# Patient Record
Sex: Female | Born: 1995 | Race: White | Hispanic: No | Marital: Single | Smoking: Current every day smoker
Health system: Southern US, Community
[De-identification: ages and names within clinical notes are randomized; demographics above are authoritative.]

## PROBLEM LIST (undated history)

## (undated) ENCOUNTER — Inpatient Hospital Stay: Payer: Self-pay

## (undated) DIAGNOSIS — Z789 Other specified health status: Secondary | ICD-10-CM

## (undated) HISTORY — DX: Other specified health status: Z78.9

## (undated) HISTORY — PX: OTHER SURGICAL HISTORY: SHX169

---
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
## 2013-03-21 ENCOUNTER — Emergency Department: Payer: Self-pay | Admitting: Emergency Medicine

## 2013-03-21 LAB — COMPREHENSIVE METABOLIC PANEL
Albumin: 3.5 g/dL — ABNORMAL LOW (ref 3.8–5.6)
Alkaline Phosphatase: 55 U/L — ABNORMAL LOW (ref 82–169)
Anion Gap: 7 (ref 7–16)
Bilirubin,Total: 0.2 mg/dL (ref 0.2–1.0)
Calcium, Total: 8.9 mg/dL — ABNORMAL LOW (ref 9.0–10.7)
Chloride: 105 mmol/L (ref 98–107)
Co2: 24 mmol/L (ref 21–32)
Creatinine: 0.58 mg/dL — ABNORMAL LOW (ref 0.60–1.30)
EGFR (Non-African Amer.): >60
Glucose: 88 mg/dL (ref 65–99)
Osmolality: 269 (ref 275–301)
Potassium: 3.2 mmol/L — ABNORMAL LOW (ref 3.5–5.1)
Sodium: 136 mmol/L (ref 136–145)

## 2013-03-21 LAB — URINALYSIS, COMPLETE
Glucose,UR: 50 mg/dL (ref 0–75)
Leukocyte Esterase: NEGATIVE
Ph: 6 (ref 4.5–8.0)
Protein: 30
RBC,UR: 14 /HPF (ref 0–5)
Squamous Epithelial: 4

## 2013-03-21 LAB — CBC
RDW: 13.4 % (ref 11.5–14.5)
WBC: 11.4 10*3/uL — ABNORMAL HIGH (ref 3.6–11.0)

## 2013-03-22 LAB — GC/CHLAMYDIA PROBE AMP

## 2013-03-22 LAB — WET PREP, GENITAL

## 2013-03-28 ENCOUNTER — Emergency Department: Payer: Self-pay | Admitting: Emergency Medicine

## 2013-03-28 LAB — URINALYSIS, COMPLETE
Bacteria: NONE SEEN
Bilirubin,UR: NEGATIVE
Leukocyte Esterase: NEGATIVE
Nitrite: NEGATIVE
Protein: 30
RBC,UR: 40 /HPF (ref 0–5)
Specific Gravity: 1.03 (ref 1.003–1.030)
WBC UR: 4 /HPF (ref 0–5)

## 2013-03-28 LAB — CBC
HGB: 13 g/dL (ref 12.0–16.0)
Platelet: 232 10*3/uL (ref 150–440)
RBC: 4.32 10*6/uL (ref 3.80–5.20)
RDW: 13.1 % (ref 11.5–14.5)
WBC: 13 10*3/uL — ABNORMAL HIGH (ref 3.6–11.0)

## 2013-03-28 LAB — COMPREHENSIVE METABOLIC PANEL
Alkaline Phosphatase: 57 U/L — ABNORMAL LOW (ref 82–169)
Bilirubin,Total: 0.5 mg/dL (ref 0.2–1.0)
Calcium, Total: 8.9 mg/dL — ABNORMAL LOW (ref 9.0–10.7)
Co2: 20 mmol/L — ABNORMAL LOW (ref 21–32)
Creatinine: 0.62 mg/dL (ref 0.60–1.30)
EGFR (African American): >60
EGFR (Non-African Amer.): >60
Glucose: 96 mg/dL (ref 65–99)
Osmolality: 266 (ref 275–301)
Potassium: 3.5 mmol/L (ref 3.5–5.1)
Sodium: 134 mmol/L — ABNORMAL LOW (ref 136–145)

## 2013-03-28 LAB — LIPASE, BLOOD: Lipase: 98 U/L (ref 73–393)

## 2013-03-28 LAB — HCG, QUANTITATIVE, PREGNANCY: Beta Hcg, Quant.: 188795 m[IU]/mL — ABNORMAL HIGH

## 2013-03-30 LAB — URINE CULTURE

## 2013-06-30 ENCOUNTER — Emergency Department: Payer: Self-pay | Admitting: Emergency Medicine

## 2013-06-30 LAB — RAPID INFLUENZA A&B ANTIGENS

## 2013-07-14 ENCOUNTER — Observation Stay: Payer: Self-pay | Admitting: Obstetrics and Gynecology

## 2013-08-02 ENCOUNTER — Observation Stay: Payer: Self-pay

## 2013-08-03 LAB — GC/CHLAMYDIA PROBE AMP

## 2013-09-29 ENCOUNTER — Observation Stay: Payer: Self-pay | Admitting: Obstetrics and Gynecology

## 2013-09-29 LAB — DRUG SCREEN, URINE
Amphetamines, Ur Screen: NEGATIVE (ref ?–1000)
BENZODIAZEPINE, UR SCRN: NEGATIVE (ref ?–200)
Barbiturates, Ur Screen: NEGATIVE (ref ?–200)
COCAINE METABOLITE, UR ~~LOC~~: NEGATIVE (ref ?–300)
Cannabinoid 50 Ng, Ur ~~LOC~~: POSITIVE (ref ?–50)
MDMA (Ecstasy)Ur Screen: NEGATIVE (ref ?–500)
METHADONE, UR SCREEN: NEGATIVE (ref ?–300)
OPIATE, UR SCREEN: NEGATIVE (ref ?–300)
Phencyclidine (PCP) Ur S: NEGATIVE (ref ?–25)
TRICYCLIC, UR SCREEN: NEGATIVE (ref ?–1000)

## 2013-09-29 LAB — PROTEIN / CREATININE RATIO, URINE
Creatinine, Urine: 130.4 mg/dL — ABNORMAL HIGH (ref 30.0–125.0)
Protein, Random Urine: 21 mg/dL — ABNORMAL HIGH (ref 0–12)
Protein/Creat. Ratio: 161 mg/gCREAT (ref 0–200)

## 2013-10-11 ENCOUNTER — Inpatient Hospital Stay: Payer: Self-pay | Admitting: Obstetrics and Gynecology

## 2013-10-11 LAB — CBC WITH DIFFERENTIAL/PLATELET
Basophil #: 0 10*3/uL (ref 0.0–0.1)
Basophil %: 0.1 %
EOS PCT: 0.6 %
Eosinophil #: 0.1 10*3/uL (ref 0.0–0.7)
HCT: 35.6 % (ref 35.0–47.0)
HGB: 11.7 g/dL — ABNORMAL LOW (ref 12.0–16.0)
Lymphocyte #: 1.6 10*3/uL (ref 1.0–3.6)
Lymphocyte %: 8.8 %
MCH: 27.1 pg (ref 26.0–34.0)
MCHC: 32.8 g/dL (ref 32.0–36.0)
MCV: 82 fL (ref 80–100)
MONO ABS: 0.8 x10 3/mm (ref 0.2–0.9)
MONOS PCT: 4.6 %
NEUTROS ABS: 15.9 10*3/uL — AB (ref 1.4–6.5)
NEUTROS PCT: 85.9 %
Platelet: 215 10*3/uL (ref 150–440)
RBC: 4.32 10*6/uL (ref 3.80–5.20)
RDW: 14.2 % (ref 11.5–14.5)
WBC: 18.5 10*3/uL — AB (ref 3.6–11.0)

## 2013-10-11 LAB — GC/CHLAMYDIA PROBE AMP

## 2013-10-12 LAB — HEMATOCRIT: HCT: 33 % — ABNORMAL LOW (ref 35.0–47.0)

## 2013-10-14 ENCOUNTER — Ambulatory Visit: Payer: Self-pay | Admitting: Family Medicine

## 2013-10-14 ENCOUNTER — Observation Stay: Payer: Self-pay | Admitting: Obstetrics and Gynecology

## 2013-10-14 LAB — URINALYSIS, COMPLETE
BACTERIA: NONE SEEN
BILIRUBIN, UR: NEGATIVE
Glucose,UR: NEGATIVE mg/dL (ref 0–75)
KETONE: NEGATIVE
Leukocyte Esterase: NEGATIVE
Nitrite: NEGATIVE
Ph: 6 (ref 4.5–8.0)
RBC,UR: 7 /HPF (ref 0–5)
Specific Gravity: 1.027 (ref 1.003–1.030)
Squamous Epithelial: <1
WBC UR: 4 /HPF (ref 0–5)

## 2013-10-14 LAB — CBC
HCT: 35.8 % (ref 35.0–47.0)
HGB: 11.6 g/dL — ABNORMAL LOW (ref 12.0–16.0)
MCH: 26.9 pg (ref 26.0–34.0)
MCHC: 32.3 g/dL (ref 32.0–36.0)
MCV: 83 fL (ref 80–100)
Platelet: 222 10*3/uL (ref 150–440)
RBC: 4.3 10*6/uL (ref 3.80–5.20)
RDW: 14.7 % — ABNORMAL HIGH (ref 11.5–14.5)
WBC: 16.2 10*3/uL — AB (ref 3.6–11.0)

## 2013-10-14 LAB — HCG, QUANTITATIVE, PREGNANCY: Beta Hcg, Quant.: 1493 m[IU]/mL — ABNORMAL HIGH

## 2013-10-14 LAB — COMPREHENSIVE METABOLIC PANEL
ALBUMIN: 2.6 g/dL — AB (ref 3.8–5.6)
ALK PHOS: 144 U/L — AB
ALT: 16 U/L (ref 12–78)
AST: 35 U/L — AB (ref 0–26)
Anion Gap: 9 (ref 7–16)
BILIRUBIN TOTAL: 0.4 mg/dL (ref 0.2–1.0)
BUN: 7 mg/dL (ref 7–18)
CALCIUM: 8.5 mg/dL — AB (ref 9.0–10.7)
CHLORIDE: 107 mmol/L (ref 98–107)
Co2: 24 mmol/L (ref 21–32)
Creatinine: 0.42 mg/dL — ABNORMAL LOW (ref 0.60–1.30)
EGFR (African American): >60
EGFR (Non-African Amer.): >60
Glucose: 92 mg/dL (ref 65–99)
Osmolality: 277 (ref 275–301)
POTASSIUM: 4 mmol/L (ref 3.5–5.1)
SODIUM: 140 mmol/L (ref 136–145)
TOTAL PROTEIN: 6.6 g/dL (ref 6.4–8.6)

## 2013-10-14 LAB — SEDIMENTATION RATE: ERYTHROCYTE SED RATE: 34 mm/h — AB (ref 0–20)

## 2013-10-15 LAB — CBC
HCT: 36.3 % (ref 35.0–47.0)
HGB: 12.1 g/dL (ref 12.0–16.0)
MCH: 27.5 pg (ref 26.0–34.0)
MCHC: 33.4 g/dL (ref 32.0–36.0)
MCV: 82 fL (ref 80–100)
Platelet: 219 10*3/uL (ref 150–440)
RBC: 4.42 10*6/uL (ref 3.80–5.20)
RDW: 14.5 % (ref 11.5–14.5)
WBC: 10.8 10*3/uL (ref 3.6–11.0)

## 2013-10-15 LAB — BASIC METABOLIC PANEL
Anion Gap: 8 (ref 7–16)
BUN: 8 mg/dL (ref 7–18)
CALCIUM: 8.6 mg/dL — AB (ref 9.0–10.7)
Chloride: 106 mmol/L (ref 98–107)
Co2: 23 mmol/L (ref 21–32)
Creatinine: 0.52 mg/dL — ABNORMAL LOW (ref 0.60–1.30)
EGFR (African American): >60
EGFR (Non-African Amer.): >60
Glucose: 104 mg/dL — ABNORMAL HIGH (ref 65–99)
Osmolality: 272 (ref 275–301)
Potassium: 3.3 mmol/L — ABNORMAL LOW (ref 3.5–5.1)
SODIUM: 137 mmol/L (ref 136–145)

## 2013-10-15 LAB — URINE CULTURE

## 2013-10-15 LAB — SEDIMENTATION RATE: Erythrocyte Sed Rate: 48 mm/hr — ABNORMAL HIGH (ref 0–20)

## 2013-10-19 LAB — CULTURE, BLOOD (SINGLE)

## 2013-11-04 ENCOUNTER — Ambulatory Visit: Payer: Self-pay | Admitting: Physician Assistant

## 2013-11-06 ENCOUNTER — Ambulatory Visit: Payer: Self-pay | Admitting: Physician Assistant

## 2013-11-08 LAB — WOUND CULTURE

## 2014-04-18 ENCOUNTER — Emergency Department: Payer: Self-pay | Admitting: Emergency Medicine

## 2014-04-18 LAB — URINALYSIS, COMPLETE
BLOOD: NEGATIVE
Bilirubin,UR: NEGATIVE
Glucose,UR: NEGATIVE mg/dL (ref 0–75)
Leukocyte Esterase: NEGATIVE
Nitrite: NEGATIVE
Ph: 5 (ref 4.5–8.0)
Protein: NEGATIVE
SPECIFIC GRAVITY: 1.033 (ref 1.003–1.030)
Squamous Epithelial: 3

## 2014-04-18 LAB — PREGNANCY, URINE: Pregnancy Test, Urine: NEGATIVE m[IU]/mL

## 2014-05-07 ENCOUNTER — Emergency Department: Payer: Self-pay | Admitting: Emergency Medicine

## 2014-05-10 ENCOUNTER — Emergency Department: Payer: Self-pay | Admitting: Emergency Medicine

## 2014-06-22 NOTE — L&D Delivery Note (Signed)
Delivery Note At 6:20 PM a viable female was delivered via Vaginal, Spontaneous Delivery (Presentation: OA;  ).  APGAR: 9, 9; weight 7 lb 1 oz (3204 g).   Placenta status: Intact, Spontaneous.  Cord: 3 vessels with the following complications: None.  Cord pH: n/a  Anesthesia: Epidural  Episiotomy: None Lacerations: None Suture Repair: n/a Est. Blood Loss (mL): 10cc (really)  Mom to postpartum.  Baby to Couplet care / Skin to Skin.  Patient presented with SROM and progressed to complete after epidrual placement.  She labored down for about an hour and after one push brought baby to the perineum.  Baby crowned and was expelled via uterine contraction and no maternal effort.  Baby was delivered and placed on mom's chest with vigorous cry.  Cord was allowed to cease pulsing before being clamped and cut by FOB.  Placenta was spontaneously delivered.  Blood loss was extraordinarily scant, with only 10cc. No lacerations.   Fredia Chittenden C 02/25/2015, 7:00 PM

## 2014-07-16 ENCOUNTER — Emergency Department: Payer: Self-pay | Admitting: Emergency Medicine

## 2014-07-16 LAB — COMPREHENSIVE METABOLIC PANEL
ANION GAP: 9 (ref 7–16)
Albumin: 3.4 g/dL (ref 3.4–5.0)
Alkaline Phosphatase: 53 U/L
BILIRUBIN TOTAL: 0.6 mg/dL (ref 0.2–1.0)
BUN: 9 mg/dL (ref 7–18)
Calcium, Total: 8.3 mg/dL — ABNORMAL LOW (ref 8.5–10.1)
Chloride: 107 mmol/L (ref 98–107)
Co2: 23 mmol/L (ref 21–32)
Creatinine: 0.65 mg/dL (ref 0.60–1.30)
EGFR (Non-African Amer.): >60
Glucose: 90 mg/dL (ref 65–99)
Osmolality: 276 (ref 275–301)
POTASSIUM: 3.5 mmol/L (ref 3.5–5.1)
SGOT(AST): 21 U/L (ref 15–37)
SGPT (ALT): 16 U/L
SODIUM: 139 mmol/L (ref 136–145)
TOTAL PROTEIN: 6.9 g/dL (ref 6.4–8.2)

## 2014-07-16 LAB — CBC WITH DIFFERENTIAL/PLATELET
Basophil #: 0 10*3/uL (ref 0.0–0.1)
Basophil %: 0.1 %
EOS ABS: 0.4 10*3/uL (ref 0.0–0.7)
Eosinophil %: 2.5 %
HCT: 36.3 % (ref 35.0–47.0)
HGB: 12 g/dL (ref 12.0–16.0)
LYMPHS PCT: 6.5 %
Lymphocyte #: 1.1 10*3/uL (ref 1.0–3.6)
MCH: 28.4 pg (ref 26.0–34.0)
MCHC: 33 g/dL (ref 32.0–36.0)
MCV: 86 fL (ref 80–100)
MONOS PCT: 4.6 %
Monocyte #: 0.8 x10 3/mm (ref 0.2–0.9)
NEUTROS PCT: 86.3 %
Neutrophil #: 15 10*3/uL — ABNORMAL HIGH (ref 1.4–6.5)
PLATELETS: 223 10*3/uL (ref 150–440)
RBC: 4.22 10*6/uL (ref 3.80–5.20)
RDW: 13.2 % (ref 11.5–14.5)
WBC: 17.4 10*3/uL — AB (ref 3.6–11.0)

## 2014-07-16 LAB — URINALYSIS, COMPLETE
BILIRUBIN, UR: NEGATIVE
Bacteria: NONE SEEN
GLUCOSE, UR: NEGATIVE mg/dL (ref 0–75)
KETONE: NEGATIVE
Leukocyte Esterase: NEGATIVE
Nitrite: NEGATIVE
Ph: 6 (ref 4.5–8.0)
Protein: 30
RBC,UR: 13 /HPF (ref 0–5)
Specific Gravity: 1.033 (ref 1.003–1.030)

## 2014-07-16 LAB — HCG, QUANTITATIVE, PREGNANCY: BETA HCG, QUANT.: 105037 m[IU]/mL — AB

## 2014-07-17 DIAGNOSIS — J309 Allergic rhinitis, unspecified: Secondary | ICD-10-CM | POA: Insufficient documentation

## 2014-07-17 DIAGNOSIS — L309 Dermatitis, unspecified: Secondary | ICD-10-CM | POA: Insufficient documentation

## 2014-08-14 ENCOUNTER — Emergency Department: Payer: Self-pay | Admitting: Emergency Medicine

## 2014-10-30 NOTE — H&P (Signed)
L&D Evaluation:  History Expanded:  HPI 19 yo, G3P2002 at 7138w4days who was sent from [facility] for higher pressures in the office, she has no ha, no visual changes, no ruq pain/ no ctx good fetal mvmt, this is a teen preganancy she is a smoker, she had pyelo 03/2013. rubNI, POS GC tx 04/2013. O pos, VZI,   Gravida 3   Term 2   PreTerm 0   Abortion 0   Living 2   Blood Type (Maternal) O positive   Group B Strep Results Maternal (Result >5wks must be treated as unknown) unknown/result > 5 weeks ago   Maternal HIV Negative   Maternal Syphilis Ab Nonreactive   Maternal Varicella Immune   Rubella Results (Maternal) nonimmune   Maternal T-Dap Immune   EDC 09-Oct-2013   Presents with high pressures   Patient's Medical History No Chronic Illness   Patient's Surgical History none   Medications Pre Natal Vitamins   Allergies PCN, per skin testing   Social History none   Family History Non-Contributory   ROS:  ROS All systems were reviewed.  HEENT, CNS, GI, GU, Respiratory, CV, Renal and Musculoskeletal systems were found to be normal.   Exam:  Vital Signs stable   Urine Protein not completed   General no apparent distress   Mental Status clear   Chest clear   Heart normal sinus rhythm   Abdomen gravid, non-tender   Estimated Fetal Weight Average for gestational age   Back no CVAT   Edema no edema   Pelvic no external lesions   Mebranes Intact   FHT cat 1   Ucx absent   Other urine positive for MJ. may explian the labile BP   Impression:  Impression evaluation for PIH   Plan:  Plan UA, PIH panel   Comments bp here are 116.60 and norklkmal pih labs are normal as well as P/C ratio   Follow Up Appointment need to schedule   Electronic Signatures: Adria DevonKlett, Amandine Covino (MD)  (Signed 11-Apr-15 09:18)  Authored: L&D Evaluation   Last Updated: 11-Apr-15 09:18 by Adria DevonKlett, Antion Andres (MD)

## 2014-10-30 NOTE — H&P (Signed)
L&D Evaluation:  History Expanded:  HPI 19 yo, G3P2002, EDD of 10/09/13 per 12 wk US, presents at 438w2d with c/o contractions. Some leaking of fluid this am. PNC at [facility], early entry to care, +GC with negative TOC, anemia for which she is not taking Fe, teen preganancy, she had pyelo 03/2013. Received TDAP in pregnancy.   Gravida 3   Term 2   PreTerm 0   Abortion 0   Living 2   Blood Type (Maternal) O positive   Group B Strep Results Maternal (Result >5wks must be treated as unknown) negative   Maternal HIV Negative   Maternal Syphilis Ab Nonreactive   Maternal Varicella Immune   Rubella Results (Maternal) nonimmune   Maternal T-Dap Immune   EDC 09-Oct-2013   Presents with contractions   Patient's Medical History No Chronic Illness   Patient's Surgical History none   Medications Pre Natal Vitamins   Allergies PCN, per skin testing   Social History none   Family History Non-Contributory   ROS:  ROS All systems were reviewed.  HEENT, CNS, GI, GU, Respiratory, CV, Renal and Musculoskeletal systems were found to be normal.   Exam:  Vital Signs stable   Urine Protein not completed   General no apparent distress   Mental Status clear   Chest clear   Heart normal sinus rhythm   Abdomen gravid, non-tender   Estimated Fetal Weight Average for gestational age   Back no CVAT   Edema no edema   Pelvic no external lesions   Mebranes Intact   FHT category 2: baseline 140, moderate variability, no accelerations so far   Ucx regular per pt - not picking up well on toco   Impression:  Impression active labor   Plan:  Plan monitor contractions and for cervical change   Comments Admission for labor - IV medication if desired.  Consider AROM and internal monitors if needed   Electronic Signatures: Fransico Sciandra, Marta Lamasamara K (CNM)  (Signed 22-Apr-15 11:01)  Authored: L&D Evaluation   Last Updated: 22-Apr-15 11:01 by Vella KohlerBrothers, Kemya Shed K (CNM)

## 2014-10-30 NOTE — H&P (Signed)
L&D Evaluation:  History Expanded:   HPI 19 yo G2P1 at 4736 1/[redacted] weeks EGA w estimated date of confinement 12/10/11 [facility] who presents w LBP, DFM, and edema.  No contractions, vaginal bleeding, ROM.    Patient's Medical History No Chronic Illness    Patient's Surgical History T&A    Medications Pre Natal Vitamins    Allergies PCN    Social History none    Family History Non-Contributory   ROS:   ROS All systems were reviewed.  HEENT, CNS, GI, GU, Respiratory, CV, Renal and Musculoskeletal systems were found to be normal.   Exam:   Vital Signs stable    General no apparent distress    Mental Status clear    Abdomen gravid, non-tender    Estimated Fetal Weight Average for gestational age    Back no CVAT    Edema 1+    Pelvic no external lesions, FT/30/-2    FHT normal rate with no decels    Ucx absent    Skin no rashes   Impression:   Impression Back Pain and DFM and mild edema   Plan:   Plan UA, EFM/NST, monitor contractions and for cervical change, fluids   Electronic Signatures: Letitia LibraHarris, Robert Paul (MD)  (Signed [phone]-May-13 20:48)  Authored: L&D Evaluation   Last Updated: 24-May-13 20:48 by Letitia LibraHarris, Robert Paul (MD)

## 2014-10-30 NOTE — H&P (Signed)
L&D Evaluation:  History Expanded:  HPI 19 yo G3P2 at 3627 w 2 days, [facility] patient who was seen today for her glucola dn mkentioned that she had decreased fetal movement. she was sent over from the HD and was placed int he monitor, pt has not had tdap yet, she was dxd withpyelonephritis 03/28/13, at ER but urine showed no growth. she is RUBNI, txd for gonorrhea 11/14, with azithromycin and got ceftriaxone, edc  4/21 by 12 week us and lmp. PCN allergy by skin testing   Gravida 3   Term 2   PreTerm 0   Abortion 0   Living 2   Blood Type (Maternal) O positive   Group B Strep Results Maternal (Result >5wks must be treated as unknown) unknown/result > 5 weeks ago   Maternal HIV Negative   Maternal Syphilis Ab Nonreactive   Maternal Varicella Immune   Rubella Results (Maternal) nonimmune   Maternal T-Dap Unknown   [facility]   Presents with decreased fetal movement   Patient's Medical History No Chronic Illness   Patient's Surgical History none   Medications Pre Natal Vitamins   Allergies PCN, by skin testing   Social History none  second hand smoke   Family History Non-Contributory   ROS:  ROS All systems were reviewed.  HEENT, CNS, GI, GU, Respiratory, CV, Renal and Musculoskeletal systems were found to be normal.   Exam:  Vital Signs stable   Urine Protein not completed   General no apparent distress   Mental Status clear   Chest clear   Heart normal sinus rhythm   Abdomen gravid, non-tender   Estimated Fetal Weight Average for gestational age   Back no CVAT   Edema no edema   Pelvic no external lesions   Mebranes Intact   FHT normal rate with no decels, cat 2-27 weeks but reassuring,   Fetal Heart Rate 140   Skin dry   Lymph no lymphadenopathy   Impression:  Impression decreased fetal movement   Plan:  Plan EFM/NST   Comments pt looks good and tradcing is reassurng, fetus is moving well.   Follow Up Appointment in 2 weeks    Electronic Signatures: Adria DevonKlett, Jowana Thumma (MD)  (Signed 23-Jan-15 13:05)  Authored: L&D Evaluation   Last Updated: 23-Jan-15 13:05 by Adria DevonKlett, Roseann Kees (MD)

## 2014-10-30 NOTE — H&P (Signed)
L&D Evaluation:  History Expanded:  HPI 19 year old, G3P2002, who is postpartum day #3 s/p uncomplicated spontaneous vaginal delivery.  She has had an uncomplicated postpartum course apart from having some mild back pain.  She has had a headache her entire pregnancy.  However, it was worse in the past dayh or so.  It is now 6/10.  She denies visual changes, sinus congestion, and face pain.  The pain in her head is at the back of her head (not on her neck).  She denies trouble breathing, nausea, vomiting, diarrhea, urinary symptoms, abdominal pain, swelling in her legs.  She had a fever at home to 101.  She had a fever in the ER to 100.59F after taking Tylenol.   Presents with back pain, fever   Patient's Medical History 1) eczema, 2) bronchitis   Patient's Surgical History none   Medications none   Allergies PCN, unknown reaction   Social History none   ROS:  General normal, Fever   HEENT headache   CNS denies neck pain, visual disturbances, loss of sensation or strength.   GI normal   GU normal   Resp normal   CV normal   Renal normal   MS back pain starting about 3cm above site of epidural extending down to lower back   Exam:  Vital Signs T 98 then 100.4 oral, P98, RR 18, BP 127/73, O2 97%RA   Urine Protein negative dipstick   General no apparent distress   Mental Status clear   Chest clear   Heart normal sinus rhythm   Abdomen soft, nontender, nondistended, uterus is firm and nontender   Back no erythema, induration, warmth, there is tenderness extending just along the spine from about 3 cm abve site of epidral down to sacrum   Edema no edema   Reflexes 2+   Skin multiple erythematous, non-scaly patches, that blotch   Other Pelvic ultrasound 4/25: FINDINGS: Intrauterine gestational sac: None. Endometrial stripe thickness is normal at 0.6 cm. The uterus appears mildly heterogeneous.  Yolk sac:  None. Embryo:  None. Maternal uterus/adnexae: The  ovaries are normal in size and appearance. No free pelvic fluid is identified.  IMPRESSION: No evidence of retained products of conception. No acute abnormality.  10/14/13: CT abd/pelvis report FINDINGS: No parenchymal consolidation is seen in the visualized lung bases. There are small bilateral pleural effusions. The liver, gallbladder, spleen, adrenal glands, left kidney, and pancreas have an unremarkable enhanced appearance. There is mild right hydronephrosis. No urinary tract calculi are identified.  Oral contrast is present in nondilated loops of small and large bowel. Moderate amount of stool present in colon. The appendix is identified in the right mid to lower abdomen and is grossly unremarkable.  The uterus is diffusely enlarged, consistent with recent postpartum state. No free fluid or enlarged lymph nodes are identified. Epidural gas in the lower thoracic spinal canal is partially visualized and likely related to recent epidural. No acute osseous abnormality is identified.  IMPRESSION: 1. Mild right hydronephrosis. 2. Small bilateral pleural effusions. 3. Enlarged uterus, consistent with recent postpartum state.   Impression:  Impression 1) postpartum day #3 s/p spontaneous vaginal delivery, 2) fever of unknown origin, 3) back pain   Plan:  Comments given fever, back pain and headache I will admit for observation. She has no overt signs of meningitis, there is no nuchal rigidity, no neurologic deficits. Will have anesthesia weigh in.   Labs:  Lab Results: Hepatic:  25-Apr-15 10:47   Bilirubin,  Total 0.4  Alkaline Phosphatase  144 (45-117 NOTE: New Reference Range 05/12/13)  SGPT (ALT) 16  SGOT (AST)  35  Total Protein, Serum 6.6  Albumin, Serum  2.6  Routine Micro:  25-Apr-15 14:13   Micro Text Report BLOOD CULTURE   COMMENT                   NO GROWTH IN 8-12 HOURS   ANTIBIOTIC                       Micro Text Report BLOOD CULTURE   COMMENT                    NO GROWTH IN 8-12 HOURS   ANTIBIOTIC                       Culture Comment NO GROWTH IN 8-12 HOURS  Result(s) reported on 14 Oct 2013 at 10:00PM.  Culture Comment NO GROWTH IN 8-12 HOURS  Result(s) reported on 14 Oct 2013 at 10:00PM.  Routine Chem:  25-Apr-15 10:47   HCG Betasubunit Quant. Serum  1493 (1-3  (International Unit)  ----------------- Non-pregnant <5 Weeks Post LMP mIU/mL  3- 4 wk 9 - 130  4- 5 wk 75 - 2,600  5- 6 wk 850 - 20,800  6- 7 wk 4,000 - 100,000  7-12 wk 11,500 - 289,000 12-16 wk 18,000 - 137,000 16-29 wk 1,400 - 53,000 29-41 wk 940 - 60,000)  Glucose, Serum 92  BUN 7  Creatinine (comp)  0.42  Sodium, Serum 140  Potassium, Serum 4.0  Chloride, Serum 107  CO2, Serum 24  Calcium (Total), Serum  8.5  Osmolality (calc) 277  eGFR (African American) >60  eGFR (Non-African American) >60 (eGFR values <64m/min/1.73 m2 may be an indication of chronic kidney disease (CKD). Calculated eGFR is useful in patients with stable renal function. The eGFR calculation will not be reliable in acutely ill patients when serum creatinine is changing rapidly. It is not useful in  patients on dialysis. The eGFR calculation may not be applicable to patients at the low and high extremes of body sizes, pregnant women, and vegetarians.)  Result Comment POTASSIUM/AST - Slight hemolysis, interpret results with  - caution.  Result(s) reported on 14 Oct 2013 at 11:25AM.  Anion Gap 9  Routine UA:  25-Apr-15 10:47   Color (UA) Yellow  Clarity (UA) Clear  Glucose (UA) Negative  Bilirubin (UA) Negative  Ketones (UA) Negative  Specific Gravity (UA) 1.027  Blood (UA) 1+  pH (UA) 6.0  Protein (UA) 30 mg/dL  Nitrite (UA) Negative  Leukocyte Esterase (UA) Negative (Result(s) reported on 14 Oct 2013 at 11:30AM.)  RBC (UA) 7 /HPF  WBC (UA) 4 /HPF  Bacteria (UA) NONE SEEN  Epithelial Cells (UA) <1 /HPF  Mucous (UA) PRESENT (Result(s) reported on 14 Oct 2013 at 11:30AM.)   Routine Hem:  25-Apr-15 10:47   Erythrocyte Sed Rate  34 (Result(s) reported on 14 Oct 2013 at 05:24PM.)  WBC (CBC)  16.2  RBC (CBC) 4.30  Hemoglobin (CBC)  11.6  Hematocrit (CBC) 35.8  Platelet Count (CBC) 222 (Result(s) reported on 14 Oct 2013 at 11:18AM.)  MCV 83  MCH 26.9  MCHC 32.3  RDW  14.7   Electronic Signatures: JWill Bonnet(MD)  (Signed 25-Apr-15 22:36)  Authored: L&D Evaluation, Labs   Last Updated: 25-Apr-15 22:36 by JWill Bonnet(MD)

## 2014-10-30 NOTE — H&P (Signed)
L&D Evaluation:  History:  HPI 19 yo G3P2 at 30w 2 days, [facility] patient, presents with c/o vaginal discharge x 3 days. She reports odor "at times" and some mild itching. No VB. Was treated for BV 2 months ago with Flagyl. Last month she had the flu and was given Tamiflu. She was dxd with pyelonephritis 03/28/13, at ER but urine showed no growth. She was also  txd for gonorrhea 11/14, with azithromycin and got ceftriaxone. EDC 4/21 by 12 week us and lmp. PCN allergy by skin testing   Presents with vaginal discharge   Patient's Medical History No Chronic Illness   Patient's Surgical History Tonsilectomy   Medications Pre Natal Vitamins   Allergies PCN, by skin testing   Social History none  second hand smoke   Family History Non-Contributory   ROS:  ROS see HPI   Exam:  Vital Signs stable  129/82   Urine Protein not completed   General no apparent distress   Mental Status clear   Abdomen gravid, non-tender   Pelvic no external lesions, wet prep of mucoid white discharge positive for clue cells   Mebranes Intact   FHT normal rate with no decels, 140 baseline with accels to 150s-160s   FHT Description CAT 1   Ucx absent   Impression:  Impression IUP at 30.2 weeks with bacterial vaginosis.   Plan:  Plan discharge, RX for Metrogel vag gel with instructions for use.   Electronic Signatures: Trinna BalloonGutierrez, Abie Killian L (CNM)  (Signed 11-Feb-15 22:10)  Authored: L&D Evaluation   Last Updated: 11-Feb-15 22:10 by Trinna BalloonGutierrez, Kai Calico L (CNM)

## 2014-12-19 ENCOUNTER — Emergency Department: Payer: 59

## 2014-12-19 ENCOUNTER — Emergency Department
Admission: EM | Admit: 2014-12-19 | Discharge: 2014-12-19 | Disposition: A | Discharge status: Home / Self Care | Payer: 59 | Attending: Emergency Medicine | Admitting: Emergency Medicine

## 2014-12-19 ENCOUNTER — Encounter: Payer: Self-pay | Admitting: Urgent Care

## 2014-12-19 DIAGNOSIS — R112 Nausea with vomiting, unspecified: Secondary | ICD-10-CM

## 2014-12-19 DIAGNOSIS — K529 Noninfective gastroenteritis and colitis, unspecified: Secondary | ICD-10-CM | POA: Insufficient documentation

## 2014-12-19 DIAGNOSIS — Z880 Allergy status to penicillin: Secondary | ICD-10-CM | POA: Diagnosis not present

## 2014-12-19 LAB — CBC WITH DIFFERENTIAL/PLATELET
Basophils Absolute: 0.1 10*3/uL (ref 0–0.1)
Basophils Relative: 1 %
Eosinophils Absolute: 0.1 10*3/uL (ref 0–0.7)
Eosinophils Relative: 1 %
HCT: 33.4 % — ABNORMAL LOW (ref 35.0–47.0)
Hemoglobin: 11 g/dL — ABNORMAL LOW (ref 12.0–16.0)
LYMPHS PCT: 5 %
Lymphs Abs: 0.8 10*3/uL — ABNORMAL LOW (ref 1.0–3.6)
MCH: 27.4 pg (ref 26.0–34.0)
MCHC: 32.9 g/dL (ref 32.0–36.0)
MCV: 83.3 fL (ref 80.0–100.0)
Monocytes Absolute: 0.4 10*3/uL (ref 0.2–0.9)
Monocytes Relative: 3 %
NEUTROS ABS: 13.6 10*3/uL — AB (ref 1.4–6.5)
Neutrophils Relative %: 90 %
Platelets: 225 10*3/uL (ref 150–440)
RBC: 4 MIL/uL (ref 3.80–5.20)
RDW: 13.4 % (ref 11.5–14.5)
WBC: 14.9 10*3/uL — AB (ref 3.6–11.0)

## 2014-12-19 LAB — COMPREHENSIVE METABOLIC PANEL
ALT: 11 U/L — ABNORMAL LOW (ref 14–54)
AST: 17 U/L (ref 15–41)
Albumin: 3.2 g/dL — ABNORMAL LOW (ref 3.5–5.0)
Alkaline Phosphatase: 65 U/L (ref 38–126)
Anion gap: 8 (ref 5–15)
BUN: 7 mg/dL (ref 6–20)
CHLORIDE: 111 mmol/L (ref 101–111)
CO2: 19 mmol/L — AB (ref 22–32)
CREATININE: 0.43 mg/dL — AB (ref 0.44–1.00)
Calcium: 8.4 mg/dL — ABNORMAL LOW (ref 8.9–10.3)
GFR calc non Af Amer: >60 mL/min (ref >60)
Glucose, Bld: 101 mg/dL — ABNORMAL HIGH (ref 65–99)
POTASSIUM: 3.4 mmol/L — AB (ref 3.5–5.1)
Sodium: 138 mmol/L (ref 135–145)
Total Bilirubin: 0.6 mg/dL (ref 0.3–1.2)
Total Protein: 6.8 g/dL (ref 6.5–8.1)

## 2014-12-19 LAB — URINALYSIS COMPLETE WITH MICROSCOPIC (ARMC ONLY)
Bilirubin Urine: NEGATIVE
Glucose, UA: NEGATIVE mg/dL
NITRITE: NEGATIVE
PROTEIN: 30 mg/dL — AB
Specific Gravity, Urine: 1.026 (ref 1.005–1.030)
pH: 5 (ref 5.0–8.0)

## 2014-12-19 LAB — LIPASE, BLOOD: Lipase: 28 U/L (ref 22–51)

## 2014-12-19 MED ORDER — LOPERAMIDE HCL 2 MG PO CAPS
2.0000 mg | ORAL_CAPSULE | ORAL | Status: DC | PRN
  Administered 2014-12-19: 2 mg via ORAL

## 2014-12-19 MED ORDER — SODIUM CHLORIDE 0.9 % IV SOLN
1000.0000 mL | Freq: Once | INTRAVENOUS | Status: AC
  Administered 2014-12-19: 1000 mL via INTRAVENOUS

## 2014-12-19 MED ORDER — PROMETHAZINE HCL 25 MG PO TABS: 25.0000 mg | ORAL_TABLET | Freq: Three times a day (TID) | ORAL | Status: DC | PRN

## 2014-12-19 MED ORDER — METOCLOPRAMIDE HCL 10 MG PO TABS
10.0000 mg | ORAL_TABLET | Freq: Once | ORAL | Status: AC
  Administered 2014-12-19: 10 mg via ORAL

## 2014-12-19 MED ORDER — PROMETHAZINE HCL 25 MG PO TABS
25.0000 mg | ORAL_TABLET | Freq: Once | ORAL | Status: AC
  Administered 2014-12-19: 25 mg via ORAL

## 2014-12-19 MED ORDER — OXYCODONE-ACETAMINOPHEN 5-325 MG PO TABS
ORAL_TABLET | ORAL | Status: AC
  Administered 2014-12-19: 1 via ORAL
  Filled 2014-12-19: qty 1

## 2014-12-19 MED ORDER — METOCLOPRAMIDE HCL 10 MG PO TABS
ORAL_TABLET | ORAL | Status: AC
  Administered 2014-12-19: 10 mg via ORAL
  Filled 2014-12-19: qty 1

## 2014-12-19 MED ORDER — LOPERAMIDE HCL 2 MG PO CAPS
ORAL_CAPSULE | ORAL | Status: AC
  Administered 2014-12-19: 2 mg via ORAL
  Filled 2014-12-19: qty 1

## 2014-12-19 MED ORDER — METOCLOPRAMIDE HCL 5 MG/ML IJ SOLN: 10.0000 mg | Freq: Once | INTRAMUSCULAR | Status: DC

## 2014-12-19 MED ORDER — PROMETHAZINE HCL 25 MG PO TABS
ORAL_TABLET | ORAL | Status: AC
  Administered 2014-12-19: 25 mg via ORAL
  Filled 2014-12-19: qty 1

## 2014-12-19 MED ORDER — OXYCODONE-ACETAMINOPHEN 5-325 MG PO TABS
1.0000 | ORAL_TABLET | Freq: Once | ORAL | Status: AC
  Administered 2014-12-19: 1 via ORAL

## 2014-12-19 NOTE — Discharge Instructions (Signed)

## 2014-12-19 NOTE — ED Notes (Signed)
Patient transported to Ultrasound 

## 2014-12-19 NOTE — ED Provider Notes (Signed)
[facility] Emergency Department Provider Note  ____________________________________________  Time seen: 3:30 AM  I have reviewed the triage vital signs and the nursing notes.   HISTORY  Chief Complaint Nausea; Emesis; Diarrhea; and Abdominal Pain      HPI Annette White is a 19 y.o. female resents with acute onset of nonbloody vomiting and diarrheatimes one day. Of note patient is approximately [redacted] weeks pregnant. Patient admits to generalized abdominal cramping since onset of vomiting and diarrhea. Last episode of emesis and diarrhea was approximately 2:00 AM area     Past medical history Gravida 4 para 3 There are no active problems to display for this patient.   History reviewed. No pertinent past surgical history.  Current Outpatient Rx  Name  Route  Sig  Dispense  Refill  . promethazine (PHENERGAN) 25 MG tablet   Oral   Take 1 tablet (25 mg total) by mouth every 8 (eight) hours as needed for nausea or vomiting.   20 tablet   0     Allergies Penicillins  No family history on file.  Social History History  Substance Use Topics  . Smoking status: Never Smoker   . Smokeless tobacco: Not on file  . Alcohol Use: No    Review of Systems  Constitutional: Negative for fever. Eyes: Negative for visual changes. ENT: Negative for sore throat. Cardiovascular: Negative for chest pain. Respiratory: Negative for shortness of breath. Gastrointestinal: Positive for abdominal pain, vomiting and diarrhea. Genitourinary: Negative for dysuria. Musculoskeletal: Negative for back pain. Skin: Negative for rash. Neurological: Negative for headaches, focal weakness or numbness.   10-point ROS otherwise negative.  ____________________________________________   PHYSICAL EXAM:  VITAL SIGNS: ED Triage Vitals  Enc Vitals Group     BP 12/19/14 0208 100/61 mmHg     Pulse Rate 12/19/14 0208 133     Resp 12/19/14 0208 20     Temp 12/19/14  0208 98.9 F (37.2 C)     Temp Source 12/19/14 0208 Oral     SpO2 12/19/14 0208 98 %     Weight 12/19/14 0208 178 lb (80.74 kg)     Height 12/19/14 0208  (1.549 m)     Head Cir --      Peak Flow --      Pain Score 12/19/14 0209 9     Pain Loc --      Pain Edu? --      Excl. in GC? --      Constitutional: Alert and oriented. Well appearing and in no distress. Eyes: Conjunctivae are normal. PERRL. Normal extraocular movements. ENT   Head: Normocephalic and atraumatic.   Nose: No congestion/rhinnorhea.   Mouth/Throat: Mucous membranes are moist.   Neck: No stridor. Cardiovascular: Normal rate, regular rhythm. Normal and symmetric distal pulses are present in all extremities. No murmurs, rubs, or gallops. Respiratory: Normal respiratory effort without tachypnea nor retractions. Breath sounds are clear and equal bilaterally. No wheezes/rales/rhonchi. Gastrointestinal: Soft and nontender. No distention. There is no CVA tenderness. Genitourinary: deferred Musculoskeletal: Nontender with normal range of motion in all extremities. No joint effusions.  No lower extremity tenderness nor edema. Neurologic:  Normal speech and language. No gross focal neurologic deficits are appreciated. Speech is normal.  Skin:  Skin is warm, dry and intact. No rash noted. Psychiatric: Mood and affect are normal. Speech and behavior are normal. Patient exhibits appropriate insight and judgment.  ____________________________________________    LABS (pertinent positives/negatives) Labs Reviewed  CBC WITH  DIFFERENTIAL/PLATELET - Abnormal; Notable for the following:    WBC 14.9 (*)    Hemoglobin 11.0 (*)    HCT 33.4 (*)    Neutro Abs 13.6 (*)    Lymphs Abs 0.8 (*)    All other components within normal limits  COMPREHENSIVE METABOLIC PANEL - Abnormal; Notable for the following:    Potassium 3.4 (*)    CO2 19 (*)    Glucose, Bld 101 (*)    Creatinine, Ser 0.43 (*)    Calcium 8.4 (*)     Albumin 3.2 (*)    ALT 11 (*)    All other components within normal limits  URINALYSIS COMPLETEWITH MICROSCOPIC ([facility] ONLY) - Abnormal; Notable for the following:    Color, Urine YELLOW (*)    APPearance CLOUDY (*)    Ketones, ur 1+ (*)    Hgb urine dipstick 1+ (*)    Protein, ur 30 (*)    Leukocytes, UA 3+ (*)    Bacteria, UA RARE (*)    Squamous Epithelial / LPF 6-30 (*)    All other components within normal limits  LIPASE, BLOOD       RADIOLOGY Pelvic ultrasound revealed:  IMPRESSION: Single living intrauterine gestation measuring 29 weeks 5 days by BPD. Cephalic presentation. Anterior placenta without previa. Normal fluid volume. Cervix is closed.  This exam is performed on an emergent basis and does not comprehensively evaluate fetal size, dating, or anatomy; follow-up complete OB US should be considered if further fetal assessment is warranted.     INITIAL IMPRESSION / ASSESSMENT AND PLAN / ED COURSE  Pertinent labs & imaging results that were available during my care of the patient were reviewed by me and considered in my medical decision making (see chart for details).  History of physical exam consistent with possible acute gastroenteritis, ultrasound revealed single live intrauterine pregnancy approximately 29 weeks 5 days  ____________________________________________   FINAL CLINICAL IMPRESSION(S) / ED DIAGNOSES  Final diagnoses:  Gastroenteritis, acute      Darci Currentandolph N Barth Trella, MD 12/19/14 [phone]

## 2014-12-19 NOTE — ED Notes (Signed)
MD at bedside. 

## 2014-12-19 NOTE — ED Notes (Addendum)
Patient presents with c/o diffuse abd pain with (+) N/V/D since having a TDAP shot yesterday around 0900. Patient reporting that she is only able to void small amounts, however denies dysuria. (+) RIGHT deltoid pain secondary to injection. Patient is a 7 month G4P3A0.

## 2014-12-19 NOTE — ED Notes (Signed)
Brown,MD consulted. MD made aware of presenting complaints and triage assessment. MD aware that patient is a 28 week G4P3. MD with [facility] for Phenergan  PO and Imodium  PO now. Orders to be entered and carried by this RN.

## 2015-02-17 IMAGING — CT CT ABD-PELV W/ CM
2 of 4 series · 17 of 46 positions shown, 19 images · IV contrast (agent unspecified)
Comparison: None.

CLINICAL DATA: Abdominal and pelvic pain. Vaginal delivery 3 days
ago. Lower back pain and right lower quadrant pain with headache.

EXAM:
CT ABDOMEN AND PELVIS WITH CONTRAST
TECHNIQUE: Multidetector CT imaging of the abdomen and pelvis was performed
using the standard protocol following bolus administration of
intravenous contrast.
CONTRAST:  100 mL 3sovue-VAA

[Series 2: routine abd pel with · axial · 0.68mm/px · z∈[-636,-192]mm · 14 of 97 slices shown, 16 images]
[im 4/97  soft-tissue]
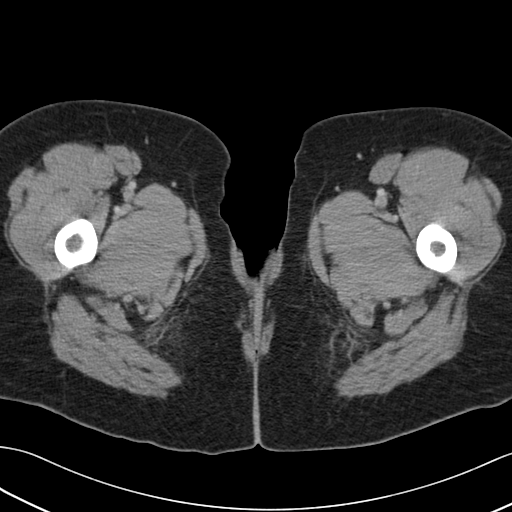
[im 4/97  bone]
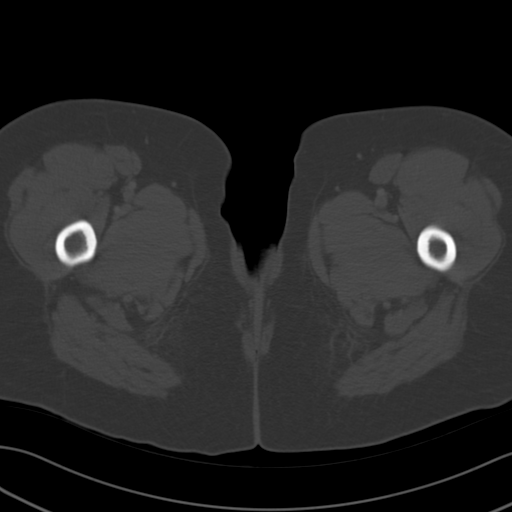
[im 12/97  soft-tissue]
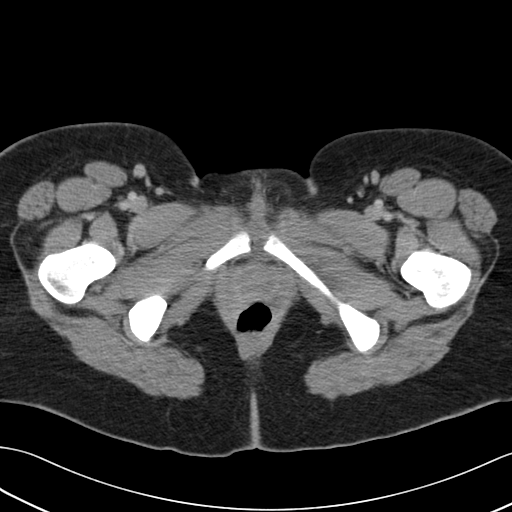
[im 20/97  soft-tissue]
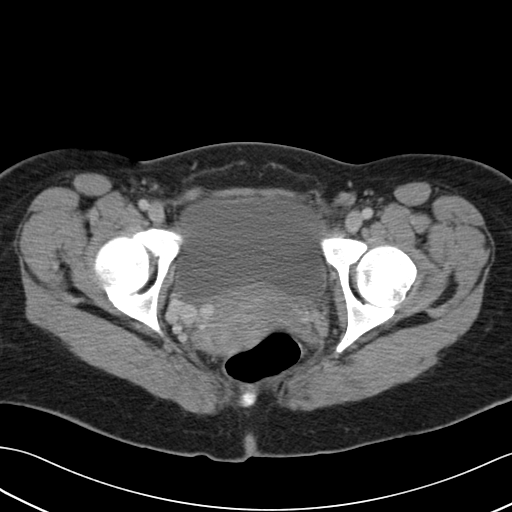
[im 27/97  soft-tissue]
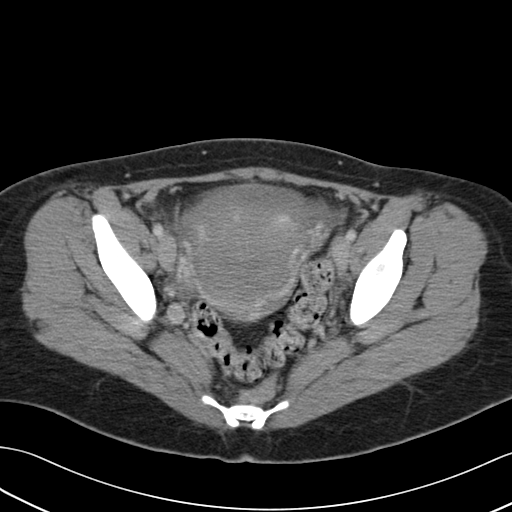
[im 31/97  soft-tissue]
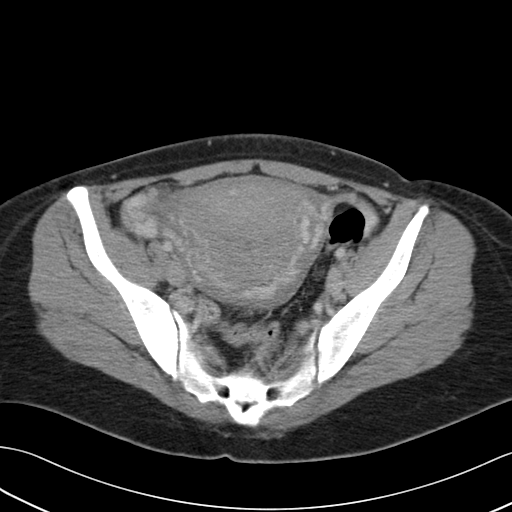
[im 39/97  soft-tissue]
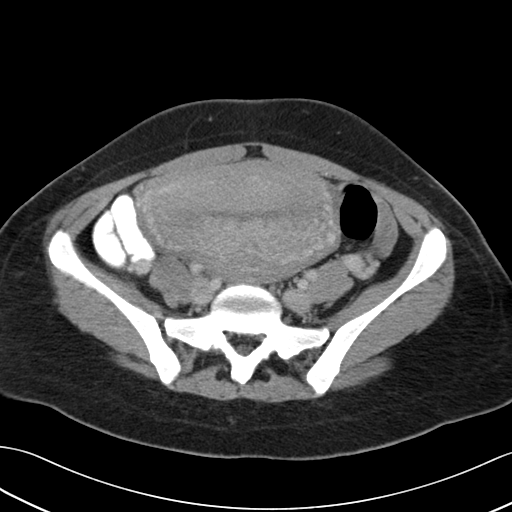
[im 47/97  soft-tissue]
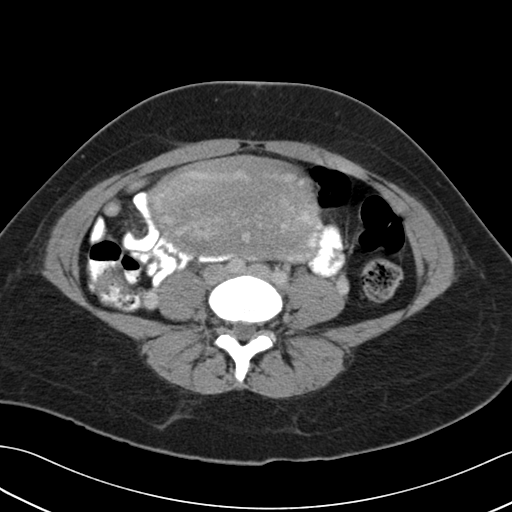
[im 50/97  soft-tissue]
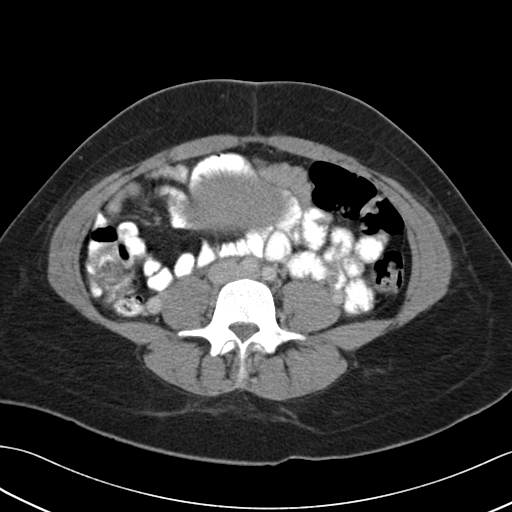
[im 58/97  soft-tissue]
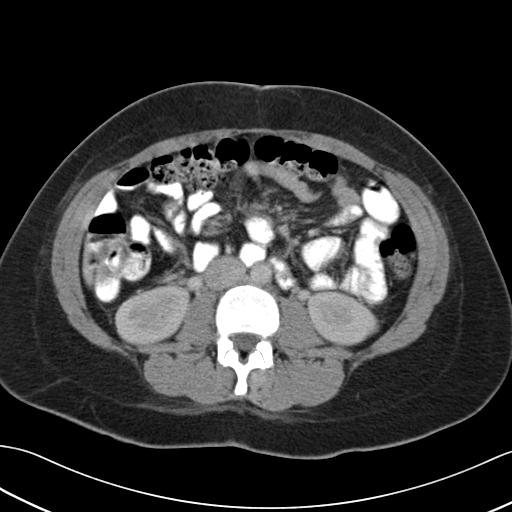
[im 58/97  bone]
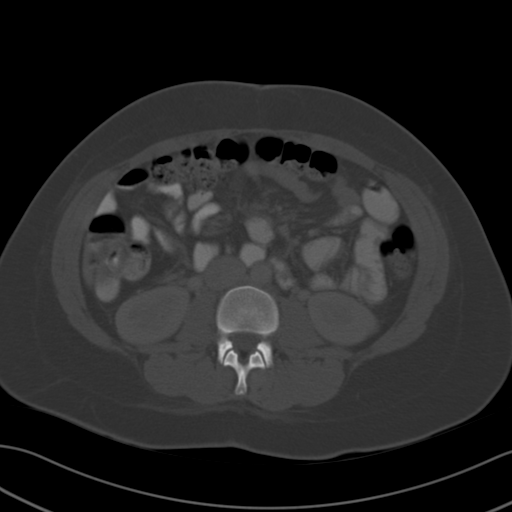
[im 66/97  soft-tissue]
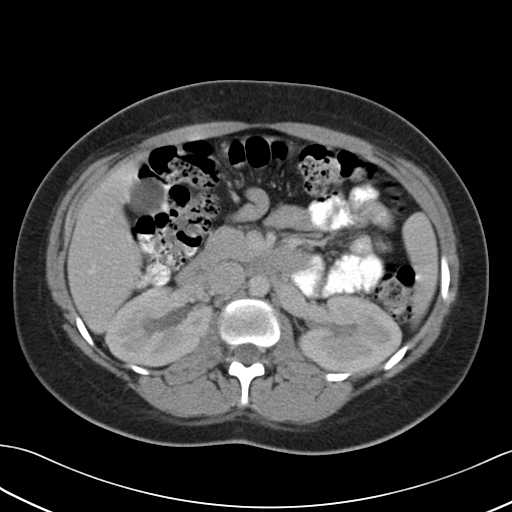
[im 73/97  soft-tissue]
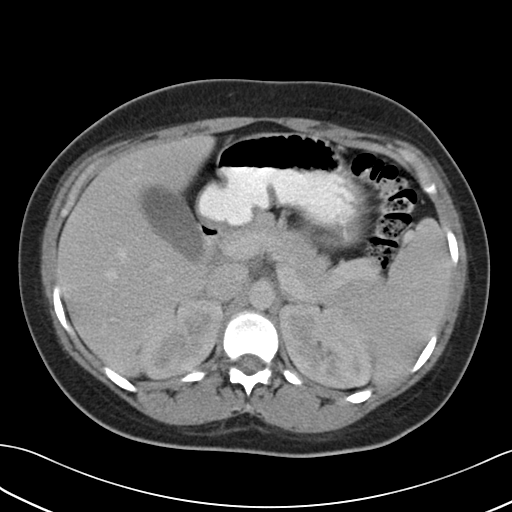
[im 77/97  soft-tissue]
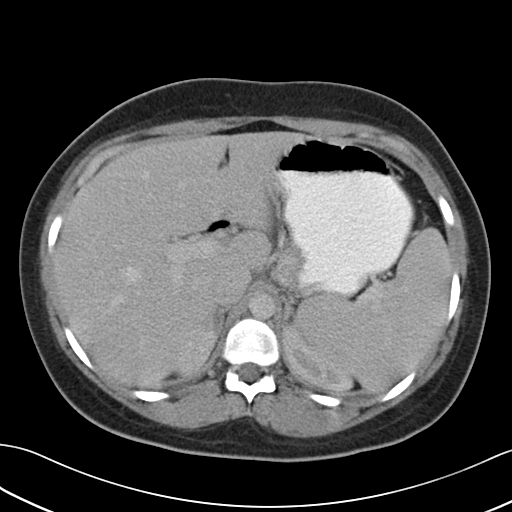
[im 85/97  soft-tissue]
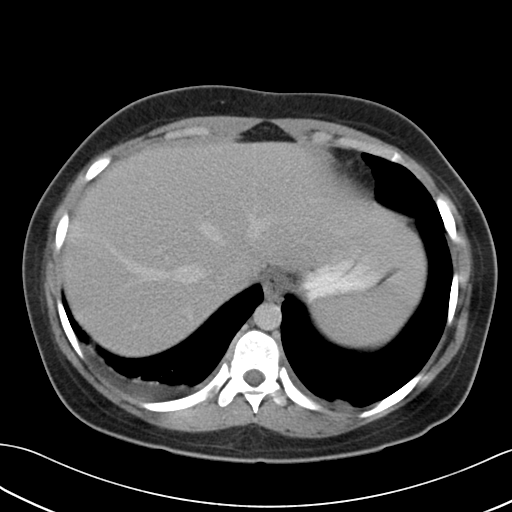
[im 93/97  soft-tissue]
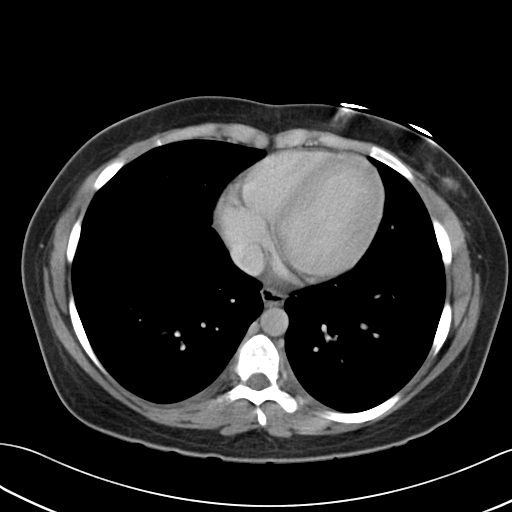

[Series 5: cor routine abd pel with · coronal · 0.82mm/px · 3 of 133 slices shown]
[im 45/133  soft-tissue]
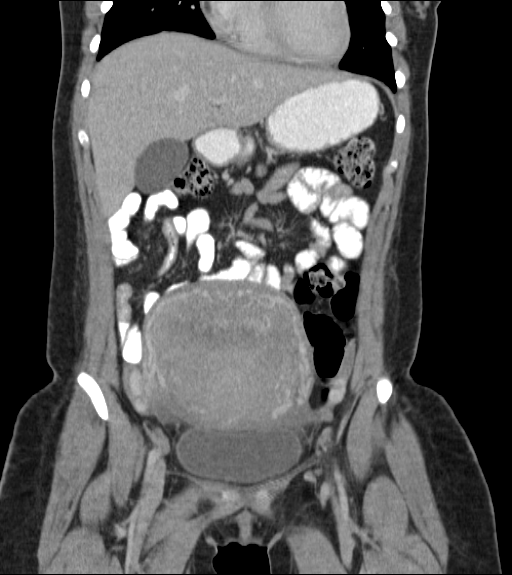
[im 59/133  soft-tissue]
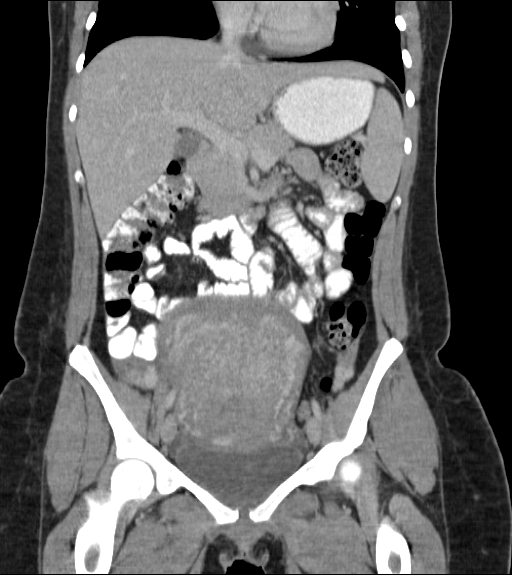
[im 74/133  soft-tissue]
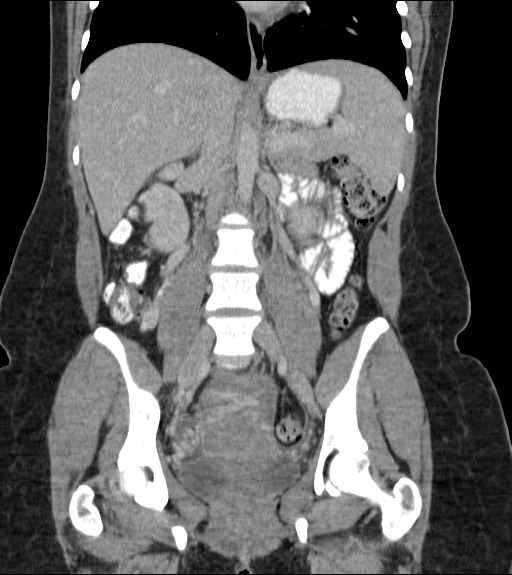

[17 of 46 positions shown; findings below may reference images not displayed]

FINDINGS: No parenchymal consolidation is seen in the visualized lung bases.
There are small bilateral pleural effusions.

The liver, gallbladder, spleen, adrenal glands, left kidney, and
pancreas have an unremarkable enhanced appearance. There is mild
right hydronephrosis. No urinary tract calculi are identified.

Oral contrast is present in nondilated loops of small and large
bowel. Moderate amount of stool present in colon. The appendix is
identified in the right mid to lower abdomen and is grossly
unremarkable.

The uterus is diffusely enlarged, consistent with recent postpartum
state. No free fluid or enlarged lymph nodes are identified.

Epidural gas in the lower thoracic spinal canal is partially
visualized and likely related to recent epidural. No acute osseous
abnormality is identified.
IMPRESSION: 1. Mild right hydronephrosis.
2. Small bilateral pleural effusions.
3. Enlarged uterus, consistent with recent postpartum state.

## 2015-02-17 IMAGING — US US OB < 14 WEEKS
1 series · 14 of 28 positions shown · non-contrast
Comparison: None.

CLINICAL DATA: Elevated white blood cell count. Three days
postpartum patient. Pelvic pain. Quantitative HCG 3025.

EXAM:
OBSTETRIC <14 WK ULTRASOUND
TECHNIQUE: Transabdominal ultrasound was performed for evaluation of the
gestation as well as the maternal uterus and adnexal regions.

[Series 1: us ob < 14 weeks · 0.24mm/px · 14 of 36 slices shown]
[im 2/36]
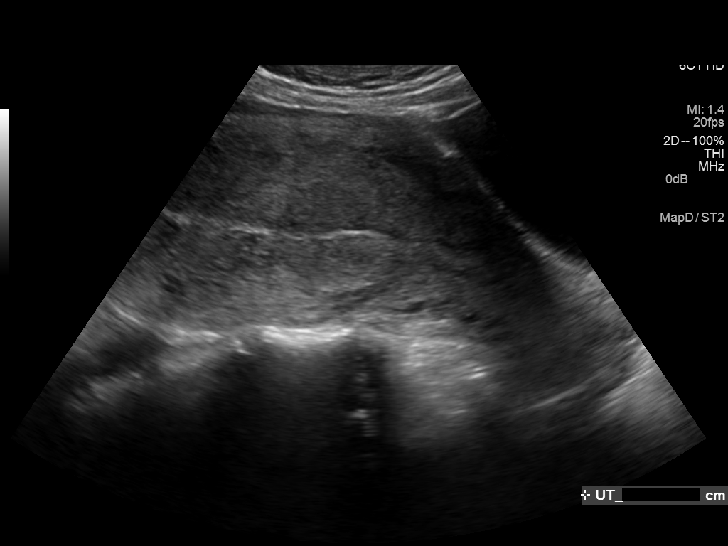
[im 4/36]
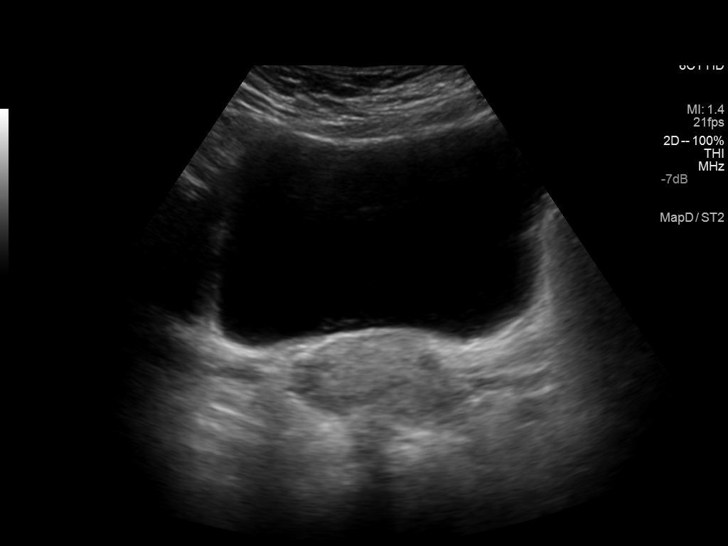
[im 7/36]
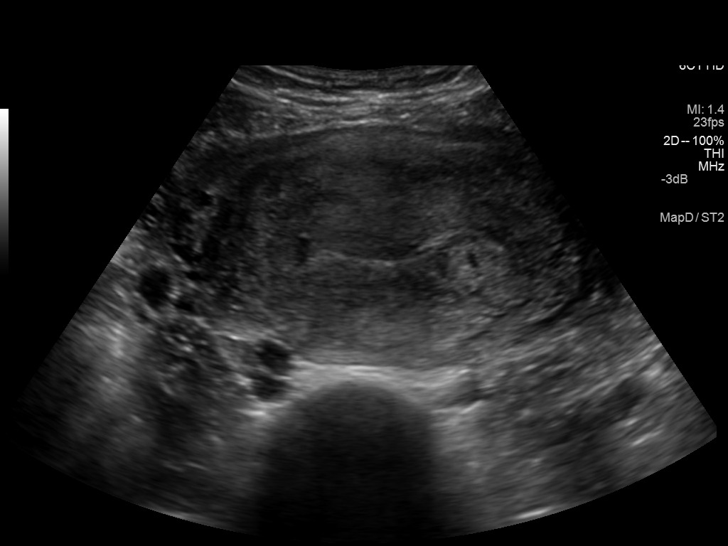
[im 10/36]
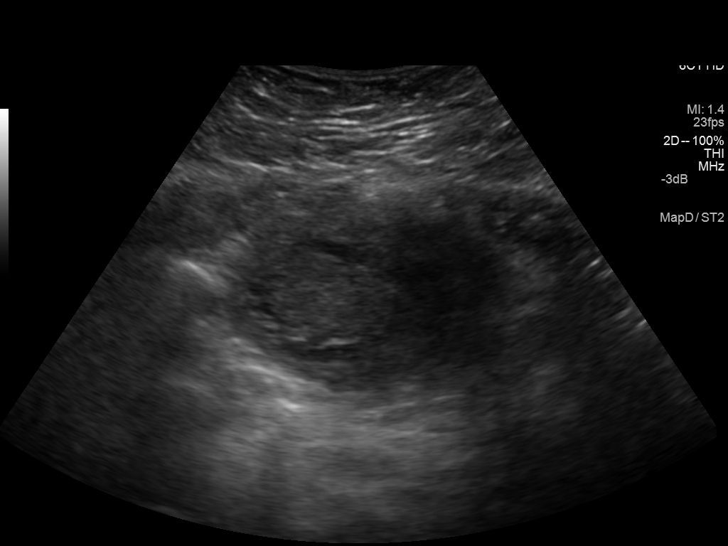
[im 12/36]
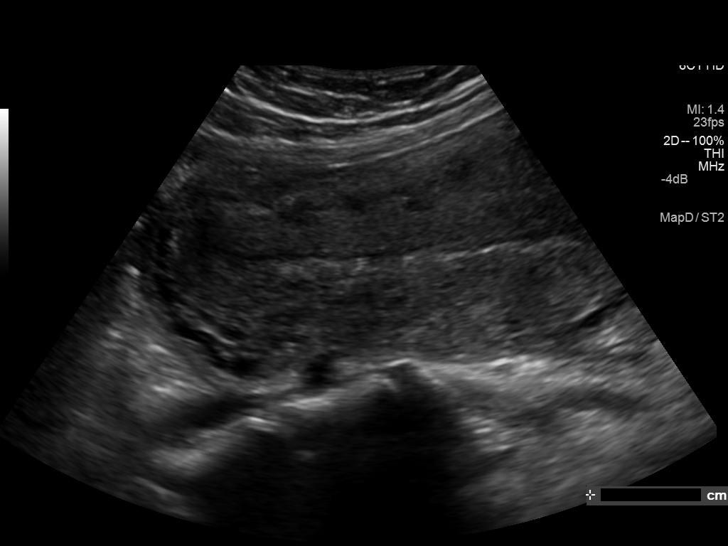
[im 15/36]
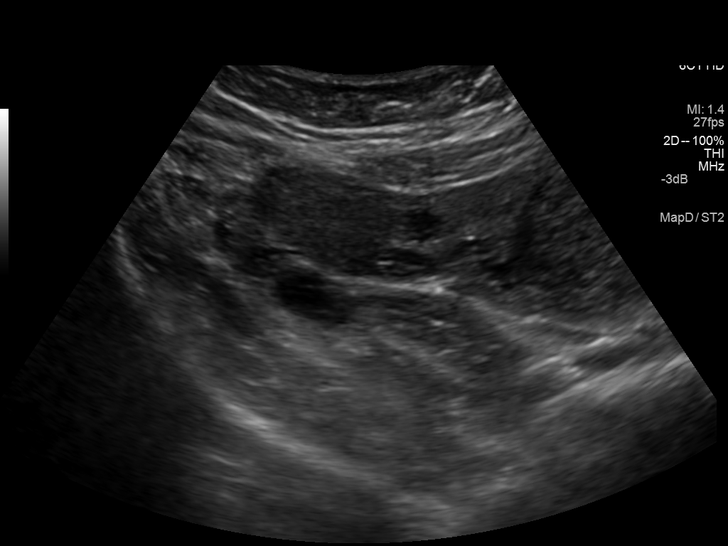
[im 17/36]
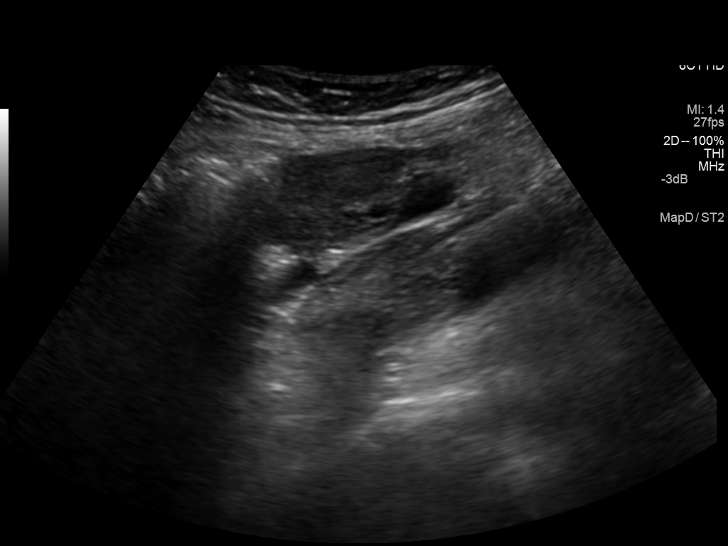
[im 20/36]
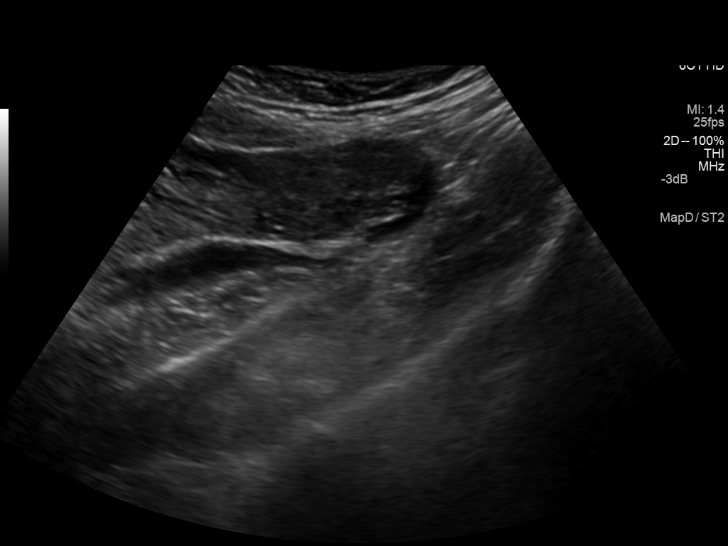
[im 23/36]
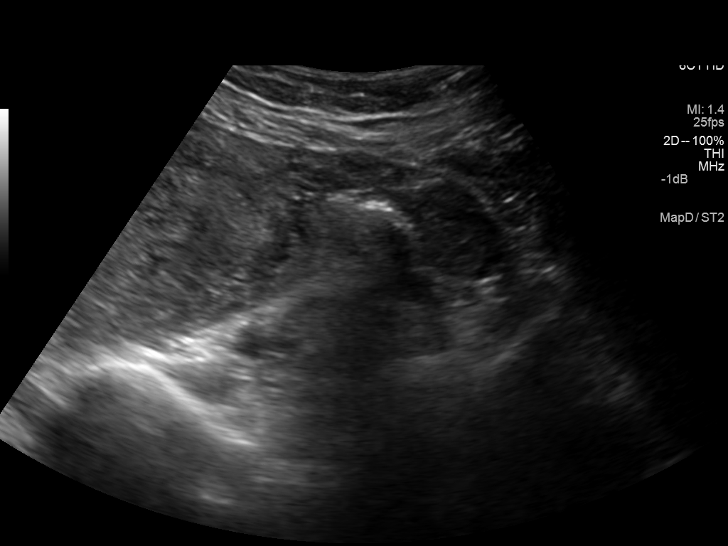
[im 25/36]
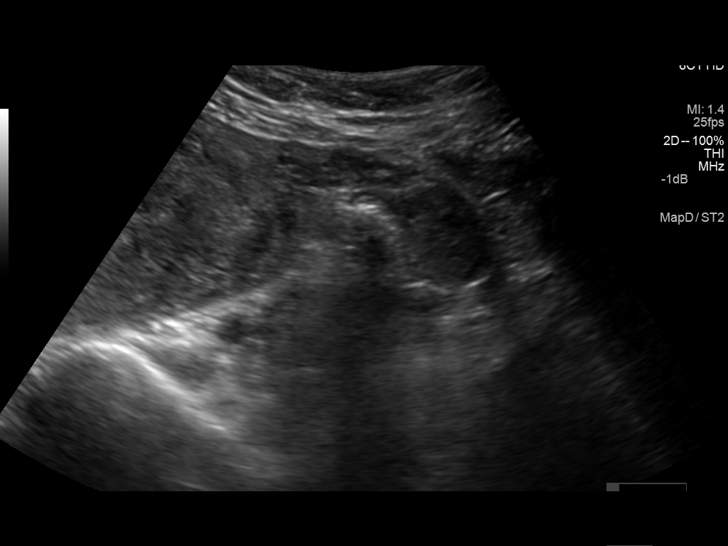
[im 28/36]
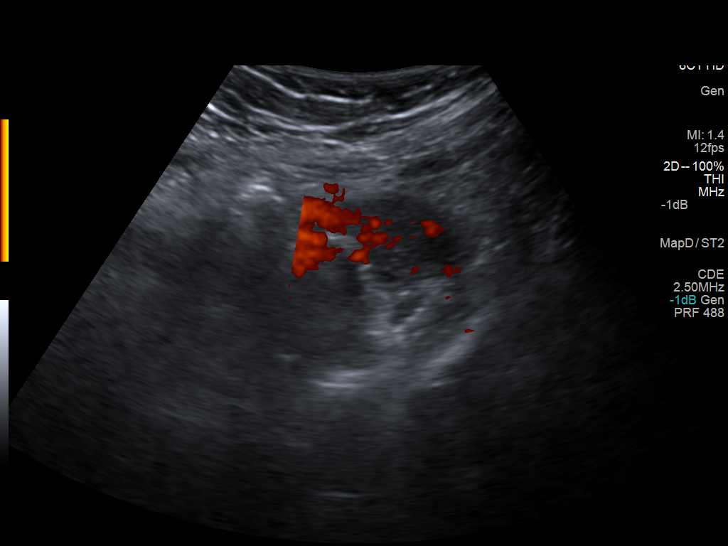
[im 30/36]
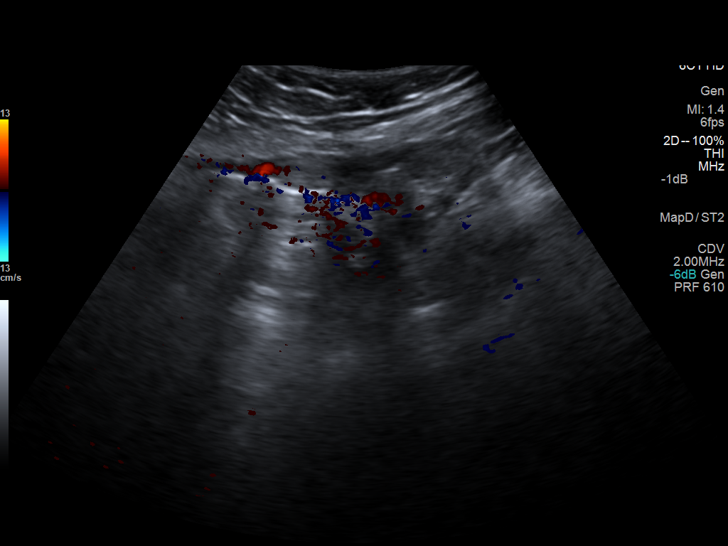
[im 33/36]
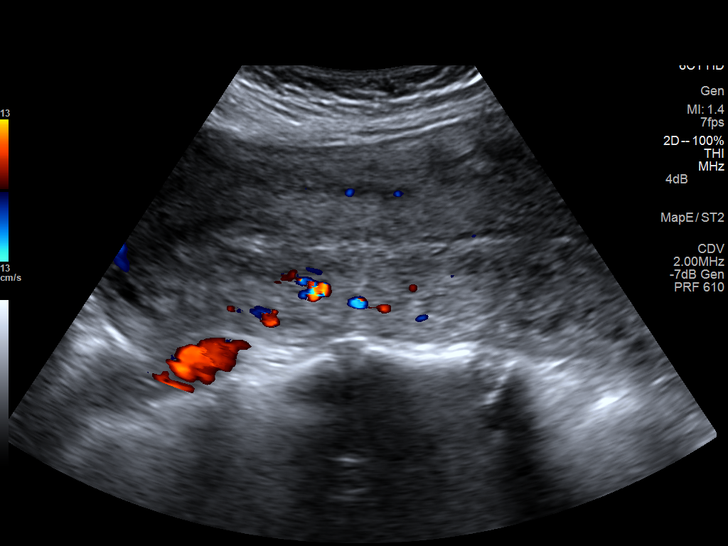
[im 36/36]
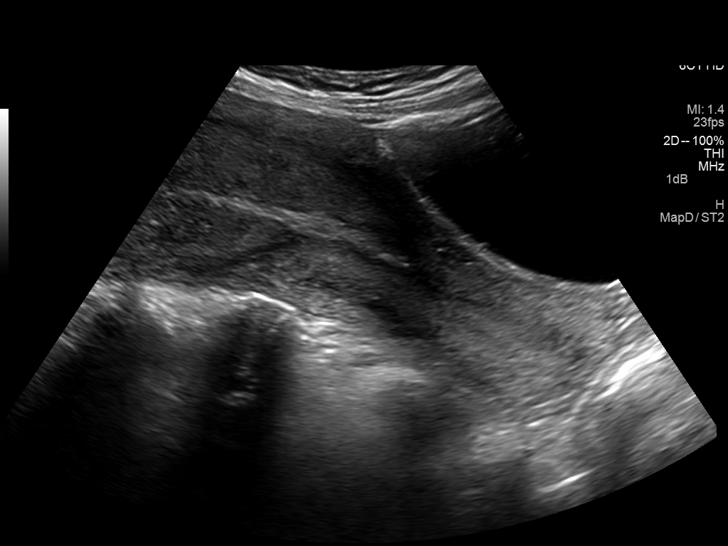

[14 of 28 positions shown; findings below may reference images not displayed]

FINDINGS: Intrauterine gestational sac: None. Endometrial stripe thickness is
normal at 0.6 cm. The uterus appears mildly heterogeneous.

Yolk sac:  None.

Embryo:  None.

Maternal uterus/adnexae: The ovaries are normal in size and
appearance. No free pelvic fluid is identified.
IMPRESSION: No evidence of retained products of conception. No acute
abnormality.

## 2015-02-25 ENCOUNTER — Encounter: Payer: Self-pay | Admitting: Anesthesiology

## 2015-02-25 ENCOUNTER — Inpatient Hospital Stay: Payer: 59 | Admitting: Anesthesiology

## 2015-02-25 ENCOUNTER — Inpatient Hospital Stay
Admission: EM | Admit: 2015-02-25 | Discharge: 2015-02-27 | DRG: 775 | Disposition: A | Discharge status: Home / Self Care | Payer: 59 | Attending: Obstetrics & Gynecology | Admitting: Obstetrics & Gynecology

## 2015-02-25 DIAGNOSIS — Z1611 Resistance to penicillins: Secondary | ICD-10-CM | POA: Diagnosis present

## 2015-02-25 DIAGNOSIS — Z3A40 40 weeks gestation of pregnancy: Secondary | ICD-10-CM | POA: Diagnosis present

## 2015-02-25 DIAGNOSIS — O99824 Streptococcus B carrier state complicating childbirth: Secondary | ICD-10-CM | POA: Diagnosis present

## 2015-02-25 DIAGNOSIS — O9902 Anemia complicating childbirth: Secondary | ICD-10-CM | POA: Diagnosis present

## 2015-02-25 DIAGNOSIS — O480 Post-term pregnancy: Secondary | ICD-10-CM | POA: Diagnosis present

## 2015-02-25 LAB — CHLAMYDIA/NGC RT PCR (ARMC ONLY)
Chlamydia Tr: NOT DETECTED
N gonorrhoeae: NOT DETECTED

## 2015-02-25 LAB — RAPID HIV SCREEN (HIV 1/2 AB+AG)
HIV 1/2 ANTIBODIES: NONREACTIVE
HIV-1 P24 ANTIGEN - HIV24: NONREACTIVE

## 2015-02-25 LAB — CBC
HCT: 31.8 % — ABNORMAL LOW (ref 35.0–47.0)
HEMOGLOBIN: 10.2 g/dL — AB (ref 12.0–16.0)
MCH: 24.2 pg — ABNORMAL LOW (ref 26.0–34.0)
MCHC: 32.1 g/dL (ref 32.0–36.0)
MCV: 75.3 fL — ABNORMAL LOW (ref 80.0–100.0)
PLATELETS: 276 10*3/uL (ref 150–440)
RBC: 4.22 MIL/uL (ref 3.80–5.20)
RDW: 16 % — ABNORMAL HIGH (ref 11.5–14.5)
WBC: 13 10*3/uL — AB (ref 3.6–11.0)

## 2015-02-25 LAB — TYPE AND SCREEN
ABO/RH(D): O POS
ANTIBODY SCREEN: NEGATIVE

## 2015-02-25 LAB — ABO/RH: ABO/RH(D): O POS

## 2015-02-25 MED ORDER — LACTATED RINGERS IV SOLN
INTRAVENOUS | Status: DC
  Administered 2015-02-25: 12:00:00 via INTRAVENOUS

## 2015-02-25 MED ORDER — WITCH HAZEL-GLYCERIN EX PADS: 1.0000 "application " | MEDICATED_PAD | CUTANEOUS | Status: DC | PRN

## 2015-02-25 MED ORDER — LACTATED RINGERS IV SOLN
500.0000 mL | INTRAVENOUS | Status: DC | PRN
Start: 2015-02-25 — End: 2015-02-27

## 2015-02-25 MED ORDER — BUPIVACAINE HCL (PF) 0.25 % IJ SOLN
INTRAMUSCULAR | Status: DC | PRN
  Administered 2015-02-25: 5 mL

## 2015-02-25 MED ORDER — FENTANYL 2.5 MCG/ML W/ROPIVACAINE 0.2% IN NS 100 ML EPIDURAL INFUSION (ARMC-ANES)
EPIDURAL | Status: AC
  Administered 2015-02-25: 9 mL/h via EPIDURAL
  Filled 2015-02-25: qty 100

## 2015-02-25 MED ORDER — ONDANSETRON HCL 4 MG/2ML IJ SOLN: 4.0000 mg | INTRAMUSCULAR | Status: DC | PRN

## 2015-02-25 MED ORDER — MEASLES, MUMPS & RUBELLA VAC ~~LOC~~ INJ: 0.5000 mL | INJECTION | Freq: Once | SUBCUTANEOUS | Status: DC | PRN

## 2015-02-25 MED ORDER — EPHEDRINE 5 MG/ML INJ
10.0000 mg | INTRAVENOUS | Status: DC | PRN
  Filled 2015-02-25: qty 2

## 2015-02-25 MED ORDER — ONDANSETRON HCL 4 MG PO TABS: 4.0000 mg | ORAL_TABLET | ORAL | Status: DC | PRN

## 2015-02-25 MED ORDER — FENTANYL 2.5 MCG/ML BUPIVACAINE 1/10 % EPIDURAL INFUSION (WH - ANES): 9.0000 mL/h | INTRAMUSCULAR | Status: DC | PRN

## 2015-02-25 MED ORDER — PRENATAL MULTIVITAMIN CH
1.0000 | ORAL_TABLET | Freq: Every day | ORAL | Status: DC
  Administered 2015-02-26 – 2015-02-27 (×2): 1 via ORAL
  Filled 2015-02-25 (×2): qty 1

## 2015-02-25 MED ORDER — ACETAMINOPHEN 325 MG PO TABS
650.0000 mg | ORAL_TABLET | ORAL | Status: DC | PRN
  Administered 2015-02-25 – 2015-02-27 (×2): 650 mg via ORAL

## 2015-02-25 MED ORDER — LANOLIN HYDROUS EX OINT: TOPICAL_OINTMENT | CUTANEOUS | Status: DC | PRN

## 2015-02-25 MED ORDER — VANCOMYCIN HCL IN DEXTROSE 1-5 GM/200ML-% IV SOLN
1000.0000 mg | Freq: Two times a day (BID) | INTRAVENOUS | Status: DC
Start: 2015-02-25 — End: 2015-02-26
  Administered 2015-02-25: 1000 mg via INTRAVENOUS
  Filled 2015-02-25 (×3): qty 200

## 2015-02-25 MED ORDER — SENNOSIDES-DOCUSATE SODIUM 8.6-50 MG PO TABS
2.0000 | ORAL_TABLET | ORAL | Status: DC
  Administered 2015-02-26: 2 via ORAL
  Filled 2015-02-25: qty 2

## 2015-02-25 MED ORDER — OXYTOCIN BOLUS FROM INFUSION: 500.0000 mL | INTRAVENOUS | Status: DC

## 2015-02-25 MED ORDER — DIPHENHYDRAMINE HCL 50 MG/ML IJ SOLN: 12.5000 mg | INTRAMUSCULAR | Status: DC | PRN

## 2015-02-25 MED ORDER — OXYTOCIN 40 UNITS IN LACTATED RINGERS INFUSION - SIMPLE MED
62.5000 mL/h | INTRAVENOUS | Status: DC
  Administered 2015-02-25: 62.5 mL/h via INTRAVENOUS
  Filled 2015-02-25: qty 1000

## 2015-02-25 MED ORDER — IBUPROFEN 600 MG PO TABS
600.0000 mg | ORAL_TABLET | Freq: Four times a day (QID) | ORAL | Status: DC
  Administered 2015-02-25 – 2015-02-26 (×2): 600 mg via ORAL
  Filled 2015-02-25 (×2): qty 1

## 2015-02-25 MED ORDER — LIDOCAINE HCL (PF) 1 % IJ SOLN
30.0000 mL | INTRAMUSCULAR | Status: DC | PRN
  Filled 2015-02-25: qty 30

## 2015-02-25 MED ORDER — PHENYLEPHRINE 40 MCG/ML (10ML) SYRINGE FOR IV PUSH (FOR BLOOD PRESSURE SUPPORT)
80.0000 ug | PREFILLED_SYRINGE | INTRAVENOUS | Status: DC | PRN
  Filled 2015-02-25: qty 2

## 2015-02-25 MED ORDER — AMMONIA AROMATIC IN INHA
RESPIRATORY_TRACT | Status: AC
  Filled 2015-02-25: qty 10

## 2015-02-25 MED ORDER — DIBUCAINE 1 % RE OINT: 1.0000 "application " | TOPICAL_OINTMENT | RECTAL | Status: DC | PRN

## 2015-02-25 MED ORDER — BENZOCAINE-MENTHOL 20-0.5 % EX AERO: 1.0000 "application " | INHALATION_SPRAY | CUTANEOUS | Status: DC | PRN

## 2015-02-25 MED ORDER — ONDANSETRON HCL 4 MG/2ML IJ SOLN: 4.0000 mg | Freq: Four times a day (QID) | INTRAMUSCULAR | Status: DC | PRN

## 2015-02-25 MED ORDER — ACETAMINOPHEN 325 MG PO TABS
650.0000 mg | ORAL_TABLET | ORAL | Status: DC | PRN
  Filled 2015-02-25 (×2): qty 2

## 2015-02-25 MED ORDER — SIMETHICONE 80 MG PO CHEW: 80.0000 mg | CHEWABLE_TABLET | ORAL | Status: DC | PRN

## 2015-02-25 MED ORDER — DIPHENHYDRAMINE HCL 25 MG PO CAPS: 25.0000 mg | ORAL_CAPSULE | Freq: Four times a day (QID) | ORAL | Status: DC | PRN

## 2015-02-25 MED ORDER — VARICELLA VIRUS VACCINE LIVE 1350 PFU/0.5ML IJ SUSR: 0.5000 mL | Freq: Once | INTRAMUSCULAR | Status: DC | PRN

## 2015-02-25 NOTE — Anesthesia Procedure Notes (Signed)
Epidural Patient location during procedure: OB  Staffing Anesthesiologist: Berdine Addison Performed by: anesthesiologist   Preanesthetic Checklist Completed: patient identified, site marked, surgical consent, pre-op evaluation, timeout performed, IV checked, risks and benefits discussed and monitors and equipment checked  Epidural Patient position: sitting Prep: Betadine Patient monitoring: heart rate, continuous pulse ox and blood pressure Approach: midline Location: L4-L5 Injection technique: LOR saline  Needle:  Needle type: Tuohy  Needle gauge: 18 G Needle length: 9 cm and 9 Catheter type: closed end flexible Catheter size: 20 Guage Test dose: negative and 1.5% lidocaine with Epi 1:200 K  Assessment Sensory level: T10 Events: blood not aspirated, injection not painful, no injection resistance, negative IV test and no paresthesia  Additional Notes   Patient tolerated the insertion well without complications. 1352 in. 1410 catheter in. 1412 test dose 3ml lido with epi. 1414 marcaine .25% marcaine 5ml. 1415 infusion.Reason for block:procedure for pain

## 2015-02-25 NOTE — Discharge Summary (Signed)
Obstetrical Discharge Summary  Date of Admission: 02/25/2015 Date of Discharge: 02/27/2015  Primary OB: [facility]  Gestational Age at Delivery: 40 weeks 4 days   Antepartum complications: close interval pregnancy, anemia Reason for Admission: SROM Date of Delivery:  02/25/15 Delivered By: Vikki Ports Ward Delivery Type: spontaneous vaginal delivery Intrapartum complications/course: none noted Anesthesia: epidural Placenta: spontaneous Laceration: none Episiotomy: none Newborn Data: Live born female  Birth Weight: 7 lb 1 oz (3204 g)/ Kersee/ Bottle APGAR: 9, 9    Discharge Physical Exam:PPD #2 General: NAD ABD: s/nd/nt, fundus firm and below the umbilicus (X-5)/ ML/ NT Lochia: WNL DVT Evaluation: LE non-ttp, no evidence of DVT on exam.  HEMOGLOBIN  Date Value Ref Range Status  02/26/2015 9.6* 12.0 - 16.0 g/dL Final   HGB  Date Value Ref Range Status  07/16/2014 12.0 12.0-16.0 g/dL Final   HCT  Date Value Ref Range Status  02/26/2015 30.3* 35.0 - 47.0 % Final  07/16/2014 36.3 35.0-47.0 % Final   Results for orders placed or performed during the hospital encounter of 02/25/15 (from the past 48 hour(s))  CBC     Status: Abnormal   Collection Time: 02/25/15 11:50 AM  Result Value Ref Range   WBC 13.0 (H) 3.6 - 11.0 K/uL   RBC 4.22 3.80 - 5.20 MIL/uL   Hemoglobin 10.2 (L) 12.0 - 16.0 g/dL   HCT 31.8 (L) 35.0 - 47.0 %   MCV 75.3 (L) 80.0 - 100.0 fL   MCH 24.2 (L) 26.0 - 34.0 pg   MCHC 32.1 32.0 - 36.0 g/dL   RDW 16.0 (H) 11.5 - 14.5 %   Platelets 276 150 - 440 K/uL  Type and screen     Status: None   Collection Time: 02/25/15 11:50 AM  Result Value Ref Range   ABO/RH(D) O POS    Antibody Screen NEG    Sample Expiration 02/28/2015   RPR     Status: None   Collection Time: 02/25/15 11:50 AM  Result Value Ref Range   RPR Ser Ql Non Reactive Non Reactive    Comment: (NOTE) Performed At: [facility] [place], [place] [id number] Lindon Romp  MD [id number]   ABO/Rh     Status: None   Collection Time: 02/25/15 11:50 AM  Result Value Ref Range   ABO/RH(D) O POS   Chlamydia/NGC rt PCR ([facility] only)     Status: None   Collection Time: 02/25/15  1:37 PM  Result Value Ref Range   Specimen source GC/Chlam URINE, RANDOM    Chlamydia Tr NOT DETECTED NOT DETECTED   N gonorrhoeae NOT DETECTED NOT DETECTED    Comment: (NOTE) 100  This methodology has not been evaluated in pregnant women or in 200  patients with a history of hysterectomy. 300 400  This methodology will not be performed on patients less than 69  years of age.   Varicella zoster antibody, IgG     Status: None   Collection Time: 02/25/15  7:43 PM  Result Value Ref Range   Varicella IgG 183 Immune >165 index    Comment: (NOTE)                               Negative          <135  Equivocal    135 - 165                               Positive          >165 A positive result generally indicates exposure to the pathogen or administration of specific immunoglobulins, but it is not indication of active infection or stage of disease. Performed At: [facility] [place], [place] [id number] Lindon Romp MD [id number]   Rubella screen     Status: Abnormal   Collection Time: 02/25/15  7:43 PM  Result Value Ref Range   Rubella <0.90 (L) Immune >0.99 index    Comment: (NOTE)                                Non-immune       <0.90                                Equivocal  0.90 - 0.99                                Immune           >0.99 Performed At: [facility] [place], [place] [id number] Lindon Romp MD [id number]   Rapid HIV screen (HIV 1/2 Ab+Ag)     Status: None   Collection Time: 02/25/15  7:43 PM  Result Value Ref Range   HIV-1 P24 Antigen - HIV24 NON REACTIVE NON REACTIVE   HIV 1/2 Antibodies NON REACTIVE NON REACTIVE   Interpretation (HIV Ag Ab)      A non reactive test  result means that HIV 1 or HIV 2 antibodies and HIV 1 p24 antigen were not detected in the specimen.  Hepatitis B surface antigen     Status: None   Collection Time: 02/25/15  7:43 PM  Result Value Ref Range   Hepatitis B Surface Ag Negative Negative    Comment: (NOTE) Performed At: [facility] [place], [place] [id number] Lindon Romp MD [id number]   CBC     Status: Abnormal   Collection Time: 02/26/15  7:05 AM  Result Value Ref Range   WBC 14.5 (H) 3.6 - 11.0 K/uL   RBC 3.99 3.80 - 5.20 MIL/uL   Hemoglobin 9.6 (L) 12.0 - 16.0 g/dL   HCT 30.3 (L) 35.0 - 47.0 %   MCV 75.8 (L) 80.0 - 100.0 fL   MCH 24.0 (L) 26.0 - 34.0 pg   MCHC 31.6 (L) 32.0 - 36.0 g/dL   RDW 15.6 (H) 11.5 - 14.5 %   Platelets 230 150 - 440 K/uL   Post partum course: routine, by time of discharge, patient had met all post partum goals. Postpartum Procedures: MMR ordered Disposition: stable, discharge to home.  Rh Immune globulin given: no Rubella vaccine given: RNI-ordered MMR Varicella vaccine given: N/A-VI Tdap vaccine given in AP or PP setting: antenatal Flu vaccine given in AP or PP setting: n/a  Contraception: unsure. Nexplanon vs IUD  Prenatal Labs:  O+ / RNI/ VI/ GBS positive/ RPR neg/ HIV neg/ HBsAG neg   Plan:  Jasline L Burrous was discharged to home in good condition. Follow-up appointment at [facility] with Dr Leonides Schanz  in 6 weeks   Discharge Medications:   Medication List    STOP taking these medications        promethazine 25 MG tablet  Commonly known as:  PHENERGAN      TAKE these medications        HYDROcodone-acetaminophen 5-325 MG per tablet  Commonly known as:  NORCO/VICODIN  Take 1 tablet by mouth every 6 (six) hours as needed for moderate pain or severe pain.     ibuprofen 600 MG tablet  Commonly known as:  ADVIL,MOTRIN  Take 1 tablet (600 mg total) by mouth every 6 (six) hours as needed.     measles, mumps and rubella vaccine  injection  Commonly known as:  MMR  Inject 0.5 mLs into the skin once as needed (if results show rubella non-immune or equivocal).     prenatal multivitamin Tabs tablet  Take 1 tablet by mouth daily at 12 noon.        Signed: Dalia Heading, CNM

## 2015-02-25 NOTE — Progress Notes (Signed)
2010 Pt assisted to bathroom to void.  Pericare done, clean pad and ice pack with gown.   Pt ambulated to w/c.Marland Kitchen

## 2015-02-25 NOTE — Progress Notes (Signed)
Pt transferred via w/c to room 338 in stable condition.  Newborn and FOB at bedside.  Report given.

## 2015-02-25 NOTE — Anesthesia Preprocedure Evaluation (Signed)
Anesthesia Evaluation  Patient identified by MRN, date of birth, ID band Patient awake    Reviewed: Allergy & Precautions, NPO status , Patient's Chart, lab work & pertinent test results, reviewed documented beta blocker date and time   Airway Mallampati: II  TM Distance: >3 FB     Dental  (+) Chipped   Pulmonary           Cardiovascular      Neuro/Psych    GI/Hepatic   Endo/Other    Renal/GU      Musculoskeletal   Abdominal   Peds  Hematology   Anesthesia Other Findings   Reproductive/Obstetrics                             Anesthesia Physical Anesthesia Plan  ASA: II  Anesthesia Plan: Epidural   Post-op Pain Management:    Induction:   Airway Management Planned:   Additional Equipment:   Intra-op Plan:   Post-operative Plan:   Informed Consent: I have reviewed the patients History and Physical, chart, labs and discussed the procedure including the risks, benefits and alternatives for the proposed anesthesia with the patient or authorized representative who has indicated his/her understanding and acceptance.     Plan Discussed with: CRNA  Anesthesia Plan Comments:         Anesthesia Quick Evaluation  

## 2015-02-25 NOTE — H&P (Signed)
OB History & Physical   History of Present Illness:  Chief Complaint:   HPI:  Annette White is a 19 y.o. [id number] @ 40.4 by 1st trimester ultrasound. She presents to L&D for evaluation of SROM.  +FM, no CTX, no VB  Pregnancy issues: Close interval pregnancy Anemia Penicillin Allergic, GBS+ resistant to clinda/erythromycin  Maternal Medical History:  No past medical history on file.  No past surgical history on file.  Allergies  Allergen Reactions  . Penicillins     Prior to Admission medications   Medication Sig Start Date End Date Taking? Authorizing Provider  promethazine (PHENERGAN) 25 MG tablet Take 1 tablet (25 mg total) by mouth every 8 (eight) hours as needed for nausea or vomiting. 12/19/14   Darci Current, MD     Prenatal care site: [facility]  Social History: She  reports that she has never smoked. She does not have any smokeless tobacco history on file. She reports that she does not drink alcohol.  Family History: family history is not on file.   Review of Systems: Negative x 10 systems reviewed except as noted in the HPI.     Physical Exam:  Vital Signs: LMP 05/21/2014 General: no acute distress.  HEENT: normocephalic, atraumatic Heart: regular rate & rhythm.  No murmurs/rubs/gallops Lungs: clear to auscultation bilaterally, normal respiratory effort Abdomen: soft, gravid, non-tender;  EFW:7.0 Pelvic:   External: Normal external female genitalia  Cervix: 4/80/-2  ROM: grossly ruptured Extremities: non-tender, symmetric, no edema bilaterally.  DTRs 2+ Neurologic: Alert & oriented x 3.    Pertinent Results:  Prenatal Labs: Blood type/Rh O+  Antibody screen neg  Rubella pending  RPR Pending   HBsAg pending  HIV pending  GC neg  Chlamydia neg  Genetic screening declined  1 hour GTT 69  3 hour GTT n/a  GBS Positive, resistant to clinda/e-mycin   FHT: 140 mod +accels neg decels TOCO: q54min SVE: 4/80/-2 SSE: deferred; grossly  ruptured  Position: cephalic by leopolds  Assessment:  Annette White is a 19 y.o. [id number] @ 40.4 with SROM  Plan:  1. Admit to Labor & Delivery  2. CBC, T&S, Clrs, IVF 3. GBS - vancomycin  q12hrs until delivery 4. Consents obtained. 5. Ctoco/efm; expectant management, anticipate vaginal delivery  Aleyza Salmi C  02/25/2015 12:20 PM

## 2015-02-26 LAB — CBC
HEMATOCRIT: 30.3 % — AB (ref 35.0–47.0)
HEMOGLOBIN: 9.6 g/dL — AB (ref 12.0–16.0)
MCH: 24 pg — ABNORMAL LOW (ref 26.0–34.0)
MCHC: 31.6 g/dL — AB (ref 32.0–36.0)
MCV: 75.8 fL — ABNORMAL LOW (ref 80.0–100.0)
Platelets: 230 10*3/uL (ref 150–440)
RBC: 3.99 MIL/uL (ref 3.80–5.20)
RDW: 15.6 % — ABNORMAL HIGH (ref 11.5–14.5)
WBC: 14.5 10*3/uL — AB (ref 3.6–11.0)

## 2015-02-26 MED ORDER — HYDROCODONE-ACETAMINOPHEN 5-325 MG PO TABS
1.0000 | ORAL_TABLET | Freq: Four times a day (QID) | ORAL | Status: DC | PRN
  Administered 2015-02-26: 2 via ORAL
  Filled 2015-02-26: qty 2

## 2015-02-26 MED ORDER — IBUPROFEN 800 MG PO TABS
800.0000 mg | ORAL_TABLET | Freq: Three times a day (TID) | ORAL | Status: DC | PRN
  Administered 2015-02-26: 800 mg via ORAL
  Filled 2015-02-26: qty 1

## 2015-02-26 MED ORDER — FERROUS SULFATE 325 (65 FE) MG PO TABS
325.0000 mg | ORAL_TABLET | Freq: Every day | ORAL | Status: DC
  Administered 2015-02-26 – 2015-02-27 (×2): 325 mg via ORAL
  Filled 2015-02-26 (×2): qty 1

## 2015-02-26 MED ORDER — DOCUSATE SODIUM 100 MG PO CAPS
100.0000 mg | ORAL_CAPSULE | Freq: Every day | ORAL | Status: DC
  Administered 2015-02-26 – 2015-02-27 (×2): 100 mg via ORAL
  Filled 2015-02-26 (×2): qty 1

## 2015-02-26 MED ORDER — TRAMADOL HCL 50 MG PO TABS
50.0000 mg | ORAL_TABLET | Freq: Once | ORAL | Status: AC
  Administered 2015-02-26: 50 mg via ORAL
  Filled 2015-02-26: qty 1

## 2015-02-26 NOTE — Progress Notes (Signed)
Post Partum Day 1 Subjective: voiding, tolerating PO and complaining of cramping unresolved with Motrin and heating pad  Objective: Blood pressure 111/63, pulse 77, temperature 98.7 F (37.1 C), temperature source Oral, resp. rate 20, last menstrual period 05/21/2014, SpO2 99 %, unknown if currently breastfeeding.  Physical Exam:  General: alert and no distress Lochia: appropriate Uterine Fundus: firm/ U-1/ ML/ NT DVT Evaluation: No evidence of DVT seen on physical exam.   Recent Labs  02/25/15 1150 02/26/15 0705  HGB 10.2* 9.6*  HCT 31.8* 30.3*  WBC 13.0* 14.5*  PLT 276 230    Assessment/Plan: PPD #1 15 hrs pp-stable Unrelieved cramping-will increase Motrin to 800 mgm q8 and add Norco Mild anemia-vitamins and iron Plan for discharge tomorrow  O POS/ TDAP UTD Awaiting other NOB lab results drawn on admission Kersee/ Bottle  Oral Hallgren, CNM   LOS: 1 day   Saleah Rishel 02/26/2015, 9:26 AM

## 2015-02-26 NOTE — Anesthesia Postprocedure Evaluation (Signed)
  Anesthesia Post-op Note  Patient: Annette White  Procedure(s) Performed:Labor Epidural  Anesthesia type:Epidural  Patient location: PACU  Post pain: Pain level controlled  Post assessment: Post-op Vital signs reviewed, Patient's Cardiovascular Status Stable, Respiratory Function Stable, Patent Airway and No signs of Nausea or vomiting  Post vital signs: Reviewed and stable  Last Vitals:  Filed Vitals:   02/26/15 0309  BP: 112/73  Pulse: 85  Temp: 37.2 C  Resp: 18    Level of consciousness: awake, alert  and patient cooperative  Complications: No apparent anesthesia complications

## 2015-02-27 LAB — RUBELLA SCREEN

## 2015-02-27 LAB — RPR: RPR Ser Ql: NONREACTIVE

## 2015-02-27 LAB — VARICELLA ZOSTER ANTIBODY, IGG: Varicella IgG: 183 index (ref >165)

## 2015-02-27 LAB — HEPATITIS B SURFACE ANTIGEN: Hepatitis B Surface Ag: NEGATIVE

## 2015-02-27 MED ORDER — HYDROCODONE-ACETAMINOPHEN 5-325 MG PO TABS: 1.0000 | ORAL_TABLET | Freq: Four times a day (QID) | ORAL | Status: DC | PRN

## 2015-02-27 MED ORDER — PRENATAL MULTIVITAMIN CH: 1.0000 | ORAL_TABLET | Freq: Every day | ORAL | Status: DC

## 2015-02-27 MED ORDER — MEASLES, MUMPS & RUBELLA VAC ~~LOC~~ INJ: 0.5000 mL | INJECTION | Freq: Once | SUBCUTANEOUS | Status: DC | PRN

## 2015-02-27 MED ORDER — IBUPROFEN 600 MG PO TABS: 600.0000 mg | ORAL_TABLET | Freq: Four times a day (QID) | ORAL | Status: DC | PRN

## 2015-02-27 NOTE — Progress Notes (Signed)
Pt discharged home with infant.  Discharge instructions and follow up appointment given to and reviewed with pt.  Pt verbalized understanding.  Escorted by auxillary. 

## 2015-02-27 NOTE — Discharge Instructions (Signed)
Vaginal Delivery, Care After Refer to this sheet in the next few weeks. These discharge instructions provide you with information on caring for yourself after delivery. Your caregiver may also give you specific instructions. Your treatment has been planned according to the most current medical practices available, but problems sometimes occur. Call your caregiver if you have any problems or questions after you go home. HOME CARE INSTRUCTIONS 1. Take over-the-counter or prescription medicines only as directed by your caregiver or pharmacist. 2. Do not drink alcohol, especially if you are breastfeeding or taking medicine to relieve pain. 3. Do not smoke tobacco. 4. Continue to use good perineal care. Good perineal care includes: 1. Wiping your perineum from back to front 2. Keeping your perineum clean. 3. You can do sitz baths twice a day, to help keep this area clean 5. Do not use tampons, douche or have sex until your caregiver says it is okay. 6. Shower only and avoid sitting in submerged water, aside from sitz baths 7. Wear a well-fitting bra that provides breast support. 8. Eat healthy foods. 9. Drink enough fluids to keep your urine clear or pale yellow. 10. Eat high-fiber foods such as whole grain cereals and breads, brown rice, beans, and fresh fruits and vegetables every day. These foods may help prevent or relieve constipation. 11. Avoid constipation with high fiber foods or medications, such as miralax or metamucil 12. Follow your caregiver's recommendations regarding resumption of activities such as climbing stairs, driving, lifting, exercising, or traveling. 13. Recommend avoiding sexual intercourse x 4-6 weeks.. Please use condoms if you resume intercourse before returning for 6 week check. 14. Try to have someone help you with your household activities and your newborn for at least a few days after you leave the hospital. 15. Rest as much as possible. Try to rest or take a nap  when your newborn is sleeping. 16. Increase your activities gradually. 17. Keep all of your scheduled postpartum appointments. It is very important to keep your scheduled follow-up appointments. At these appointments, your caregiver will be checking to make sure that you are healing physically and emotionally. SEEK MEDICAL CARE IF:   You are passing large clots from your vagina. Save any clots to show your caregiver.  You have a foul smelling discharge from your vagina.  You have trouble urinating.  You are urinating frequently.  You have pain when you urinate.  You have a change in your bowel movements.  You have increasing redness, pain, or swelling near your vaginal incision (episiotomy) or vaginal tear.  You have pus draining from your episiotomy or vaginal tear.  Your episiotomy or vaginal tear is separating.  You have painful, hard, or reddened breasts.  You have a severe headache.  You have blurred vision or see spots.  You feel sad or depressed.  You have thoughts of hurting yourself or your newborn.  You have questions about your care, the care of your newborn, or medicines.  You are dizzy or light-headed.  You have a rash.  You have nausea or vomiting.  You were breastfeeding and have not had a menstrual period within 12 weeks after you stopped breastfeeding.  You are not breastfeeding and have not had a menstrual period by the 12th week after delivery.  You have a fever. SEEK IMMEDIATE MEDICAL CARE IF:   You have persistent pain.  You have chest pain.  You have shortness of breath.  You faint.  You have leg pain.  You have stomach  pain.  Your vaginal bleeding saturates two or more sanitary pads in 1 hour. MAKE SURE YOU:   Understand these instructions.  Will watch your condition.  Will get help right away if you are not doing well or get worse. Document Released: 06/05/2000 Document Revised: 10/23/2013 Document Reviewed:  02/03/2012 [facility] Patient Information 2015 [place], [place]. This information is not intended to replace advice given to you by your health care provider. Make sure you discuss any questions you have with your health care provider.  Sitz Bath A sitz bath is a warm water bath taken in the sitting position. The water covers only the hips and butt (buttocks). We recommend using one that fits in the toilet, to help with ease of use and cleanliness. It may be used for either healing or cleaning purposes. Sitz baths are also used to relieve pain, itching, or muscle tightening (spasms). The water may contain medicine. Moist heat will help you heal and relax.  HOME CARE  Take 3 to 4 sitz baths a day. 18. Fill the bathtub half-full with warm water. 19. Sit in the water and open the drain a little. 20. Turn on the warm water to keep the tub half-full. Keep the water running constantly. 21. Soak in the water for 15 to 20 minutes. 22. After the sitz bath, pat the affected area dry. GET HELP RIGHT AWAY IF: You get worse instead of better. Stop the sitz baths if you get worse. MAKE SURE YOU:  Understand these instructions.  Will watch your condition.  Will get help right away if you are not doing well or get worse. Document Released: 07/16/2004 Document Revised: 03/02/2012 Document Reviewed: 10/06/2010 [facility] Patient Information 2015 [place], [place]. This information is not intended to replace advice given to you by your health care provider. Make sure you discuss any questions you have with your health care provider.

## 2016-03-21 ENCOUNTER — Emergency Department: Payer: 59

## 2016-03-21 ENCOUNTER — Emergency Department
Admission: EM | Admit: 2016-03-21 | Discharge: 2016-03-21 | Disposition: A | Discharge status: Home / Self Care | Payer: 59 | Attending: Emergency Medicine | Admitting: Emergency Medicine

## 2016-03-21 ENCOUNTER — Encounter: Payer: Self-pay | Admitting: Emergency Medicine

## 2016-03-21 DIAGNOSIS — R102 Pelvic and perineal pain: Secondary | ICD-10-CM | POA: Diagnosis present

## 2016-03-21 DIAGNOSIS — N730 Acute parametritis and pelvic cellulitis: Secondary | ICD-10-CM

## 2016-03-21 DIAGNOSIS — N739 Female pelvic inflammatory disease, unspecified: Secondary | ICD-10-CM | POA: Diagnosis not present

## 2016-03-21 DIAGNOSIS — N949 Unspecified condition associated with female genital organs and menstrual cycle: Secondary | ICD-10-CM

## 2016-03-21 LAB — URINALYSIS COMPLETE WITH MICROSCOPIC (ARMC ONLY)
BACTERIA UA: NONE SEEN
BILIRUBIN URINE: NEGATIVE
Glucose, UA: NEGATIVE mg/dL
Ketones, ur: NEGATIVE mg/dL
Nitrite: NEGATIVE
PH: 6 (ref 5.0–8.0)
Protein, ur: NEGATIVE mg/dL
Specific Gravity, Urine: 1.019 (ref 1.005–1.030)

## 2016-03-21 LAB — COMPREHENSIVE METABOLIC PANEL
ALT: 12 U/L — ABNORMAL LOW (ref 14–54)
AST: 18 U/L (ref 15–41)
Albumin: 4.3 g/dL (ref 3.5–5.0)
Alkaline Phosphatase: 55 U/L (ref 38–126)
Anion gap: 6 (ref 5–15)
BUN: 9 mg/dL (ref 6–20)
CHLORIDE: 107 mmol/L (ref 101–111)
CO2: 24 mmol/L (ref 22–32)
Calcium: 9 mg/dL (ref 8.9–10.3)
Creatinine, Ser: 0.63 mg/dL (ref 0.44–1.00)
Glucose, Bld: 101 mg/dL — ABNORMAL HIGH (ref 65–99)
POTASSIUM: 3.6 mmol/L (ref 3.5–5.1)
Sodium: 137 mmol/L (ref 135–145)
Total Bilirubin: 0.5 mg/dL (ref 0.3–1.2)
Total Protein: 7.4 g/dL (ref 6.5–8.1)

## 2016-03-21 LAB — LIPASE, BLOOD: LIPASE: 19 U/L (ref 11–51)

## 2016-03-21 LAB — CHLAMYDIA/NGC RT PCR (ARMC ONLY)
Chlamydia Tr: NOT DETECTED
N gonorrhoeae: NOT DETECTED

## 2016-03-21 LAB — CBC
HEMATOCRIT: 36.9 % (ref 35.0–47.0)
Hemoglobin: 12.7 g/dL (ref 12.0–16.0)
MCH: 28.8 pg (ref 26.0–34.0)
MCHC: 34.5 g/dL (ref 32.0–36.0)
MCV: 83.5 fL (ref 80.0–100.0)
Platelets: 215 10*3/uL (ref 150–440)
RBC: 4.42 MIL/uL (ref 3.80–5.20)
RDW: 13.4 % (ref 11.5–14.5)
WBC: 18.3 10*3/uL — AB (ref 3.6–11.0)

## 2016-03-21 LAB — WET PREP, GENITAL
Clue Cells Wet Prep HPF POC: NONE SEEN
Sperm: NONE SEEN
TRICH WET PREP: NONE SEEN
YEAST WET PREP: NONE SEEN

## 2016-03-21 LAB — POCT PREGNANCY, URINE: PREG TEST UR: NEGATIVE

## 2016-03-21 MED ORDER — DEXTROSE 5 % IV SOLN
1.0000 g | Freq: Once | INTRAVENOUS | Status: AC
  Administered 2016-03-21: 1 g via INTRAVENOUS
  Filled 2016-03-21: qty 10

## 2016-03-21 MED ORDER — ONDANSETRON HCL 4 MG/2ML IJ SOLN
4.0000 mg | Freq: Once | INTRAMUSCULAR | Status: AC
  Administered 2016-03-21: 4 mg via INTRAVENOUS
  Filled 2016-03-21: qty 2

## 2016-03-21 MED ORDER — MORPHINE SULFATE (PF) 4 MG/ML IV SOLN
4.0000 mg | Freq: Once | INTRAVENOUS | Status: AC
  Administered 2016-03-21: 4 mg via INTRAVENOUS
  Filled 2016-03-21: qty 1

## 2016-03-21 MED ORDER — IOPAMIDOL (ISOVUE-300) INJECTION 61%: 30.0000 mL | Freq: Once | INTRAVENOUS | Status: DC | PRN

## 2016-03-21 MED ORDER — SODIUM CHLORIDE 0.9 % IV SOLN
1000.0000 mL | Freq: Once | INTRAVENOUS | Status: AC
  Administered 2016-03-21: 1000 mL via INTRAVENOUS
  Filled 2016-03-21: qty 1000

## 2016-03-21 MED ORDER — DOXYCYCLINE HYCLATE 100 MG PO TABS: 100.0000 mg | ORAL_TABLET | Freq: Two times a day (BID) | ORAL | 0 refills | Status: AC

## 2016-03-21 MED ORDER — HYDROCODONE-ACETAMINOPHEN 5-325 MG PO TABS: 1.0000 | ORAL_TABLET | ORAL | 0 refills | Status: DC | PRN

## 2016-03-21 NOTE — ED Provider Notes (Addendum)
[facility] Emergency Department Provider Note   ____________________________________________    I have reviewed the triage vital signs and the nursing notes.   HISTORY  Chief Complaint Abdominal Pain     HPI Annette White is a 20 y.o. female who presents with complaints of lower abdominal and pelvic pain. Patient reports this started 3 days ago when she first noticed vaginal discharge. She denies fevers or chills. She reports the pain became more suprapubic and worse, she describes it as moderate to severe and rapid movements cause her more pain. She reports mild nausea but no vomiting. No history of similar symptoms. No dysuria   History reviewed. No pertinent past medical history.  Patient Active Problem List   Diagnosis Date Noted  . Postpartum care following vaginal delivery 02/25/2015    History reviewed. No pertinent surgical history.  Prior to Admission medications   Medication Sig Start Date End Date Taking? Authorizing Provider  doxycycline (VIBRA-TABS) 100 MG tablet Take 1 tablet (100 mg total) by mouth 2 (two) times daily. 03/21/16 03/31/16  Lavonia Drafts, MD  HYDROcodone-acetaminophen (NORCO/VICODIN) 5-325 MG tablet Take 1 tablet by mouth every 4 (four) hours as needed for moderate pain. 03/21/16   Lavonia Drafts, MD  ibuprofen (ADVIL,MOTRIN) 600 MG tablet Take 1 tablet (600 mg total) by mouth every 6 (six) hours as needed. Patient not taking: Reported on 03/21/2016 02/27/15   Dalia Heading, CNM  measles, mumps and rubella vaccine (MMR) injection Inject 0.5 mLs into the skin once as needed (if results show rubella non-immune or equivocal). Patient not taking: Reported on 03/21/2016 02/27/15   Dalia Heading, CNM  Prenatal Vit-Fe Fumarate-FA (PRENATAL MULTIVITAMIN) TABS tablet Take 1 tablet by mouth daily at 12 noon. Patient not taking: Reported on 03/21/2016 02/27/15   Dalia Heading, CNM      Allergies Sulfamethoxazole-trimethoprim and Penicillins  No family history on file.  Social History Social History  Substance Use Topics  . Smoking status: Never Smoker  . Smokeless tobacco: Not on file  . Alcohol use No    Review of Systems  Constitutional: No fever/chills Eyes: No Discharge ENT: No sore throat. Cardiovascular: Denies chest pain. Respiratory: Denies shortness of breath. Gastrointestinal: As above Genitourinary: Negative for dysuria. Vaginal discharge as above Musculoskeletal: Negative for back pain. Skin: Negative for rash. Neurological: Negative for headaches   10-point ROS otherwise negative.  ____________________________________________   PHYSICAL EXAM:  VITAL SIGNS: ED Triage Vitals  Enc Vitals Group     BP 03/21/16 1240 119/68     Pulse Rate 03/21/16 1240 (!) 130     Resp 03/21/16 1240 18     Temp 03/21/16 1240 98.9 F (37.2 C)     Temp Source 03/21/16 1240 Oral     SpO2 03/21/16 1240 100 %     Weight 03/21/16 1241 160 lb (72.6 kg)     Height 03/21/16 1241 '5\' 2"'  (1.575 m)     Head Circumference --      Peak Flow --      Pain Score 03/21/16 1242 10     Pain Loc --      Pain Edu? --      Excl. in [place]? --     Constitutional: Alert and oriented. No acute distress. Eyes: Conjunctivae are normal.   Nose: No congestion/rhinnorhea. Mouth/Throat: Mucous membranes are moist.    Cardiovascular: Significant tachycardia, regular rhythm. Grossly normal heart sounds.  Good peripheral circulation. Respiratory: Normal respiratory effort.  No retractions.  Lungs CTAB. Gastrointestinal: Soft and nontender. No distention.  No CVA tenderness. Genitourinary: Thick mucus-like discharge from cervix, positive CMT Musculoskeletal:   Warm and well perfused Neurologic:  Normal speech and language. No gross focal neurologic deficits are appreciated.  Skin:  Skin is warm, dry and intact. No rash noted. Psychiatric: Mood and affect are normal. Speech and  behavior are normal.  ____________________________________________   LABS (all labs ordered are listed, but only abnormal results are displayed)  Labs Reviewed  WET PREP, GENITAL - Abnormal; Notable for the following:       Result Value   WBC, Wet Prep HPF POC MANY (*)    All other components within normal limits  COMPREHENSIVE METABOLIC PANEL - Abnormal; Notable for the following:    Glucose, Bld 101 (*)    ALT 12 (*)    All other components within normal limits  CBC - Abnormal; Notable for the following:    WBC 18.3 (*)    All other components within normal limits  URINALYSIS COMPLETEWITH MICROSCOPIC ([facility] ONLY) - Abnormal; Notable for the following:    Color, Urine YELLOW (*)    APPearance CLEAR (*)    Hgb urine dipstick 2+ (*)    Leukocytes, UA TRACE (*)    Squamous Epithelial / LPF 0-5 (*)    All other components within normal limits  CHLAMYDIA/NGC RT PCR ([facility] ONLY)  LIPASE, BLOOD  POC URINE PREG, ED  POCT PREGNANCY, URINE   ____________________________________________  EKG  None ____________________________________________  RADIOLOGY  Ultrasound pelvis does not show TOA, complex cystic structure noted, discussed with patient the need for follow-up ultrasound in 6-8 weeks ____________________________________________   PROCEDURES  Procedure(s) performed: No    Critical Care performed: No ____________________________________________   INITIAL IMPRESSION / ASSESSMENT AND PLAN / ED COURSE  Pertinent labs & imaging results that were available during my care of the patient were reviewed by me and considered in my medical decision making (see chart for details).  Patient presents with pelvic pain and vaginal discharge with tachycardia and elevated white blood cell count. On pelvic exam she has significant vaginal discharge and CMT, I'm concerned about PID versus TOA, IV pain medications and IV Zofran given  Clinical Course  Discussed with Dr.  Star Age who recommends dose of IV Rocephin in the emergency department and discharge with doxycycline and close outpatient follow-up. I discussed this with the patient who agrees with this plan. She knows to return if any fever, worsening pain, nausea vomiting or other concerning symptoms. ____________________________________________   FINAL CLINICAL IMPRESSION(S) / ED DIAGNOSES  Final diagnoses:  CMT (cervical motion tenderness)  PID (acute pelvic inflammatory disease)      NEW MEDICATIONS STARTED DURING THIS VISIT:  New Prescriptions   DOXYCYCLINE (VIBRA-TABS) 100 MG TABLET    Take 1 tablet (100 mg total) by mouth 2 (two) times daily.   HYDROCODONE-ACETAMINOPHEN (NORCO/VICODIN) 5-325 MG TABLET    Take 1 tablet by mouth every 4 (four) hours as needed for moderate pain.     Note:  This document was prepared using Dragon voice recognition software and may include unintentional dictation errors.    Lavonia Drafts, MD 03/21/16 2001    Lavonia Drafts, MD 03/21/16 2001

## 2016-03-21 NOTE — ED Triage Notes (Signed)
Lower abdominal pain x 3 days, increasing.  Denies dysuria. States is having vaginal spotty bleeding.

## 2016-05-19 ENCOUNTER — Encounter: Payer: Self-pay | Admitting: Emergency Medicine

## 2016-05-19 ENCOUNTER — Emergency Department
Admission: EM | Admit: 2016-05-19 | Discharge: 2016-05-19 | Disposition: A | Discharge status: Home / Self Care | Payer: 59 | Attending: Emergency Medicine | Admitting: Emergency Medicine

## 2016-05-19 DIAGNOSIS — N764 Abscess of vulva: Secondary | ICD-10-CM | POA: Diagnosis not present

## 2016-05-19 DIAGNOSIS — N750 Cyst of Bartholin's gland: Secondary | ICD-10-CM

## 2016-05-19 DIAGNOSIS — Z791 Long term (current) use of non-steroidal anti-inflammatories (NSAID): Secondary | ICD-10-CM | POA: Diagnosis not present

## 2016-05-19 DIAGNOSIS — N898 Other specified noninflammatory disorders of vagina: Secondary | ICD-10-CM | POA: Diagnosis present

## 2016-05-19 LAB — URINALYSIS COMPLETE WITH MICROSCOPIC (ARMC ONLY)
BACTERIA UA: NONE SEEN
BILIRUBIN URINE: NEGATIVE
GLUCOSE, UA: NEGATIVE mg/dL
Ketones, ur: NEGATIVE mg/dL
Leukocytes, UA: NEGATIVE
Nitrite: NEGATIVE
Protein, ur: NEGATIVE mg/dL
Specific Gravity, Urine: 1.016 (ref 1.005–1.030)
pH: 7 (ref 5.0–8.0)

## 2016-05-19 LAB — WET PREP, GENITAL
CLUE CELLS WET PREP: NONE SEEN
Sperm: NONE SEEN
TRICH WET PREP: NONE SEEN
Yeast Wet Prep HPF POC: NONE SEEN

## 2016-05-19 LAB — CHLAMYDIA/NGC RT PCR (ARMC ONLY)
CHLAMYDIA TR: NOT DETECTED
N GONORRHOEAE: NOT DETECTED

## 2016-05-19 LAB — POCT PREGNANCY, URINE: Preg Test, Ur: NEGATIVE

## 2016-05-19 MED ORDER — LIDOCAINE HCL (PF) 1 % IJ SOLN
5.0000 mL | Freq: Once | INTRAMUSCULAR | Status: AC
  Administered 2016-05-19: 5 mL
  Filled 2016-05-19: qty 5

## 2016-05-19 MED ORDER — CLINDAMYCIN HCL 150 MG PO CAPS
300.0000 mg | ORAL_CAPSULE | Freq: Once | ORAL | Status: AC
  Administered 2016-05-19: 300 mg via ORAL
  Filled 2016-05-19: qty 2

## 2016-05-19 MED ORDER — FLUCONAZOLE 150 MG PO TABS: 150.0000 mg | ORAL_TABLET | Freq: Once | ORAL | 0 refills | Status: AC

## 2016-05-19 MED ORDER — HYDROCODONE-ACETAMINOPHEN 5-325 MG PO TABS: 1.0000 | ORAL_TABLET | Freq: Three times a day (TID) | ORAL | 0 refills | Status: DC | PRN

## 2016-05-19 MED ORDER — CLINDAMYCIN HCL 300 MG PO CAPS: 300.0000 mg | ORAL_CAPSULE | Freq: Three times a day (TID) | ORAL | 0 refills | Status: AC

## 2016-05-19 MED ORDER — HYDROCODONE-ACETAMINOPHEN 5-325 MG PO TABS
1.0000 | ORAL_TABLET | Freq: Once | ORAL | Status: AC
  Administered 2016-05-19: 1 via ORAL
  Filled 2016-05-19: qty 1

## 2016-05-19 NOTE — Discharge Instructions (Signed)
Take the antibiotic as directed. Follow-up with [facility] as discussed. Take warm, shallow sitz baths to promote healing.

## 2016-05-19 NOTE — ED Notes (Signed)
Pt reports that the left side of her vagina is swollen for 5 days - no drainage to area - pt also reports vaginal drainage (yellow in color with no odor) - pt denies difficulty/pain/frequency with urination

## 2016-05-19 NOTE — ED Triage Notes (Signed)
Patient presents to the ED with swollen area to her inner labia and a large amount of yellow vaginal discharge.  Patient reports symptoms began approximately 5 days ago.  Patient is in no obvious distress at this time.  Denies any other symptoms.

## 2016-05-19 NOTE — ED Provider Notes (Signed)
[facility] Emergency Department Provider Note ____________________________________________  Time seen: 1339  I have reviewed the triage vital signs and the nursing notes.  HISTORY  Chief Complaint  Vaginal Discharge and Abscess  HPI Annette White is a 20 y.o. female was instructed the ED for evaluation of a swollen tender area to the inner left labia. She also describes a resolving vaginal discharge that she describes as yellow in nature. Symptoms began about 5 days ago she had a small area of swelling and firmness to the inside of the left labia. She denies any spontaneous drainage or previous history. She also denies any fevers, chills, sweats.  History reviewed. No pertinent past medical history.  Patient Active Problem List   Diagnosis Date Noted  . Postpartum care following vaginal delivery 02/25/2015    History reviewed. No pertinent surgical history.  Prior to Admission medications   Medication Sig Start Date End Date Taking? Authorizing Provider  clindamycin (CLEOCIN) 300 MG capsule Take 1 capsule (300 mg total) by mouth 3 (three) times daily. 05/19/16 05/29/16  Alvar Malinoski V Bacon Ja Pistole, PA-C  fluconazole (DIFLUCAN) 150 MG tablet Take 1 tablet (150 mg total) by mouth once. 05/19/16 05/19/16  Hanne Kegg V Bacon Eiliana Drone, PA-C  HYDROcodone-acetaminophen (NORCO) 5-325 MG tablet Take 1 tablet by mouth 3 (three) times daily as needed. 05/19/16   Mitsy Owen V Bacon Haskel Dewalt, PA-C  ibuprofen (ADVIL,MOTRIN) 600 MG tablet Take 1 tablet (600 mg total) by mouth every 6 (six) hours as needed. Patient not taking: Reported on 03/21/2016 02/27/15   Dalia Heading, CNM  measles, mumps and rubella vaccine (MMR) injection Inject 0.5 mLs into the skin once as needed (if results show rubella non-immune or equivocal). Patient not taking: Reported on 03/21/2016 02/27/15   Dalia Heading, CNM  Prenatal Vit-Fe Fumarate-FA (PRENATAL MULTIVITAMIN) TABS tablet Take 1 tablet by mouth  daily at 12 noon. Patient not taking: Reported on 03/21/2016 02/27/15   Dalia Heading, CNM    Allergies Sulfamethoxazole-trimethoprim and Penicillins  No family history on file.  Social History Social History  Substance Use Topics  . Smoking status: Never Smoker  . Smokeless tobacco: Never Used  . Alcohol use No    Review of Systems  Constitutional: Negative for fever. Cardiovascular: Negative for chest pain. Respiratory: Negative for shortness of breath. Gastrointestinal: Negative for abdominal pain, vomiting and diarrhea. Genitourinary: Negative for dysuria. Reports vaginal discharge and left labial swelling as above.  Skin: Negative for rash. Neurological: Negative for headaches, focal weakness or numbness. ____________________________________________  PHYSICAL EXAM:  VITAL SIGNS: ED Triage Vitals  Enc Vitals Group     BP 05/19/16 1256 122/63     Pulse Rate 05/19/16 1256 97     Resp 05/19/16 1256 18     Temp 05/19/16 1256 98.6 F (37 C)     Temp Source 05/19/16 1256 Oral     SpO2 05/19/16 1256 (!) 7 %     Weight 05/19/16 1257 160 lb (72.6 kg)     Height 05/19/16 1257 '5\' 1"'  (1.549 m)     Head Circumference --      Peak Flow --      Pain Score 05/19/16 1258 9     Pain Loc --      Pain Edu? --      Excl. in [place]? --     Constitutional: Alert and oriented. Well appearing and in no distress. Head: Normocephalic and atraumatic. Hematological/Lymphatic/Immunological: No inguinal lymphadenopathy. GU: normal external genitalia, except for a firm,  cystic lesion deep to the left labia. It extends into the introitus. Normal cervix without friability. Scant, milky white vaginal discharge. No CMT or adnexal masses.  Musculoskeletal: Nontender with normal range of motion in all extremities.  Neurologic:  Normal gait without ataxia. Normal speech and language. No gross focal neurologic deficits are appreciated. Skin:  Skin is warm, dry and intact. No rash  noted. ____________________________________________   LABS (pertinent positives/negatives) Labs Reviewed  WET PREP, GENITAL - Abnormal; Notable for the following:       Result Value   WBC, Wet Prep HPF POC FEW (*)    All other components within normal limits  URINALYSIS COMPLETEWITH MICROSCOPIC ([facility] ONLY) - Abnormal; Notable for the following:    Color, Urine YELLOW (*)    APPearance CLEAR (*)    Hgb urine dipstick 1+ (*)    Squamous Epithelial / LPF 0-5 (*)    All other components within normal limits  CHLAMYDIA/NGC RT PCR ([facility] ONLY)  POC URINE PREG, ED  POCT PREGNANCY, URINE  ____________________________________________  PROCEDURES  Clindamycin 300 mg PO Norco 5-325 mg PO  INCISION AND DRAINAGE Performed by: Melvenia Needles Consent: Verbal consent obtained. Risks and benefits: risks, benefits and alternatives were discussed Type: abscess  Body area: left labia  Anesthesia: local infiltration  Local anesthetic: lidocaine 1% w/o epinephrine  Anesthetic total: 1 ml  Complexity: complex  Aspiration with 18G needle  Return amount: none  Patient tolerance: Patient tolerated the procedure well with no immediate complications. ____________________________________________  INITIAL IMPRESSION / ASSESSMENT AND PLAN / ED COURSE  Patient with what appears to be a physiologic leukorrhea without evidence of acute vaginitis. She is discharged with a prescription for Clindamycin and Norco #10. She will perform sitz baths and follow-up with [facility] for further evaluation and management. GC culture is pending at the time of discharge.   Clinical Course    ____________________________________________  FINAL CLINICAL IMPRESSION(S) / ED DIAGNOSES  Final diagnoses:  Vulvar abscess  Bartholin gland cyst      Melvenia Needles, PA-C 05/19/16 1609    Hinda Kehr, MD 05/19/16 1800

## 2017-03-18 ENCOUNTER — Encounter: Payer: Self-pay | Admitting: Emergency Medicine

## 2017-03-18 ENCOUNTER — Emergency Department
Admission: EM | Admit: 2017-03-18 | Discharge: 2017-03-18 | Disposition: A | Discharge status: Home / Self Care | Payer: 59 | Attending: Emergency Medicine | Admitting: Emergency Medicine

## 2017-03-18 DIAGNOSIS — H00015 Hordeolum externum left lower eyelid: Secondary | ICD-10-CM | POA: Diagnosis not present

## 2017-03-18 DIAGNOSIS — H5712 Ocular pain, left eye: Secondary | ICD-10-CM | POA: Diagnosis present

## 2017-03-18 MED ORDER — ERYTHROMYCIN 5 MG/GM OP OINT
TOPICAL_OINTMENT | Freq: Once | OPHTHALMIC | Status: AC
  Administered 2017-03-18: 1 via OPHTHALMIC
  Filled 2017-03-18: qty 1

## 2017-03-18 NOTE — ED Provider Notes (Signed)
[facility] Emergency Department Provider Note   ____________________________________________   First MD Initiated Contact with Patient 03/18/17 [phone]     (approximate)  I have reviewed the triage vital signs and the nursing notes.   HISTORY  Chief Complaint Facial Swelling    HPI Annette White is a 21 y.o. female who presents to the ED from home with a chief complaint of left eye pain and swelling.patient reports a 3 to four-day history of pain to the corner of her left eye associated with redness and swelling. Denies drainage. Has been applying warm compresses to the effected area without relief of symptoms. Denies vision changes, fever, chills, chest pain, shortness of breath, abdominal pain, nausea, vomiting. Denies recent travel or trauma. Nothing makes her symptoms better or worse.   Past medical history None  Patient Active Problem List   Diagnosis Date Noted  . Postpartum care following vaginal delivery 02/25/2015    History reviewed. No pertinent surgical history.  Prior to Admission medications   Medication Sig Start Date End Date Taking? Authorizing Provider  HYDROcodone-acetaminophen (NORCO) 5-325 MG tablet Take 1 tablet by mouth 3 (three) times daily as needed. 05/19/16   Menshew, Dannielle Karvonen, PA-C  ibuprofen (ADVIL,MOTRIN) 600 MG tablet Take 1 tablet (600 mg total) by mouth every 6 (six) hours as needed. Patient not taking: Reported on 03/21/2016 02/27/15   Dalia Heading, CNM  measles, mumps and rubella vaccine (MMR) injection Inject 0.5 mLs into the skin once as needed (if results show rubella non-immune or equivocal). Patient not taking: Reported on 03/21/2016 02/27/15   Dalia Heading, CNM  Prenatal Vit-Fe Fumarate-FA (PRENATAL MULTIVITAMIN) TABS tablet Take 1 tablet by mouth daily at 12 noon. Patient not taking: Reported on 03/21/2016 02/27/15   Dalia Heading, CNM    Allergies Sulfamethoxazole-trimethoprim and  Penicillins  History reviewed. No pertinent family history.  Social History Social History  Substance Use Topics  . Smoking status: Never Smoker  . Smokeless tobacco: Never Used  . Alcohol use No    Review of Systems  Constitutional: No fever/chills Eyes: positive for left eye redness and swelling. No visual changes. ENT: No sore throat. Cardiovascular: Denies chest pain. Respiratory: Denies shortness of breath. Gastrointestinal: No abdominal pain.  No nausea, no vomiting.  No diarrhea.  No constipation. Genitourinary: Negative for dysuria. Musculoskeletal: Negative for back pain. Skin: Negative for rash. Neurological: Negative for headaches, focal weakness or numbness.   ____________________________________________   PHYSICAL EXAM:  VITAL SIGNS: ED Triage Vitals  Enc Vitals Group     BP 03/18/17 0030 113/80     Pulse Rate 03/18/17 0030 94     Resp 03/18/17 0030 16     Temp 03/18/17 0030 98.8 F (37.1 C)     Temp Source 03/18/17 0030 Oral     SpO2 03/18/17 0030 98 %     Weight 03/18/17 0026 160 lb (72.6 kg)     Height --      Head Circumference --      Peak Flow --      Pain Score 03/18/17 0208 9     Pain Loc --      Pain Edu? --      Excl. in [place]? --     Constitutional: Alert and oriented. Well appearing and in no acute distress. Eyes: Conjunctivae are normal. PERRL. EOMI.  Left eye: stye noted to medial lower lid with mild redness and swelling. Head: Atraumatic. Nose: No congestion/rhinnorhea. Mouth/Throat: Mucous membranes  are moist.  Oropharynx non-erythematous. Neck: No stridor.   Cardiovascular: Normal rate, regular rhythm. Grossly normal heart sounds.  Good peripheral circulation. Respiratory: Normal respiratory effort.  No retractions. Lungs CTAB. Gastrointestinal: Soft and nontender. No distention. No abdominal bruits. No CVA tenderness. Musculoskeletal: No lower extremity tenderness nor edema.  No joint effusions. Neurologic:  Normal speech and  language. No gross focal neurologic deficits are appreciated. No gait instability. Skin:  Skin is warm, dry and intact. No rash noted. Psychiatric: Mood and affect are normal. Speech and behavior are normal.  ____________________________________________   LABS (all labs ordered are listed, but only abnormal results are displayed)  Labs Reviewed - No data to display ____________________________________________  EKG  none ____________________________________________  RADIOLOGY  No results found.  ____________________________________________   PROCEDURES  Procedure(s) performed: None  Procedures  Critical Care performed: No  ____________________________________________   INITIAL IMPRESSION / ASSESSMENT AND PLAN / ED COURSE  Pertinent labs & imaging results that were available during my care of the patient were reviewed by me and considered in my medical decision making (see chart for details).  21 year old female who presents with left lower lid stye. Encourage patient to continue warm compresses, start erythromycin ointment and patient will follow-up with ophthalmology closely. Strict return precautions given. Patient verbalizes understanding and agrees with plan of care.      ____________________________________________   FINAL CLINICAL IMPRESSION(S) / ED DIAGNOSES  Final diagnoses:  Hordeolum externum of left lower eyelid      NEW MEDICATIONS STARTED DURING THIS VISIT:  New Prescriptions   No medications on file     Note:  This document was prepared using Dragon voice recognition software and may include unintentional dictation errors.    Paulette Blanch, MD 03/18/17 [phone]

## 2017-03-18 NOTE — ED Triage Notes (Signed)
Pt c/o left eye pain and swelling, denies drainage. Pt has yellow/green scab at the left tear duct area with redness and swelling around left eye. Pt denies injury to area.

## 2017-03-18 NOTE — ED Notes (Signed)
BILATERAL EYE ACUITY  20/20 

## 2017-03-18 NOTE — Discharge Instructions (Signed)
1. Apply antibiotic ointment 1/4" to affected area 4 times daily while awake x 1 week. 2. Return to the ER for worsening symptoms, persistent vomiting, fever, vision changes or other concerns.

## 2017-06-25 ENCOUNTER — Emergency Department
Admission: EM | Admit: 2017-06-25 | Discharge: 2017-06-25 | Disposition: A | Discharge status: Home / Self Care | Payer: 59 | Attending: Student in an Organized Health Care Education/Training Program | Admitting: Student in an Organized Health Care Education/Training Program

## 2017-06-25 ENCOUNTER — Encounter: Payer: Self-pay | Admitting: Emergency Medicine

## 2017-06-25 ENCOUNTER — Other Ambulatory Visit: Payer: Self-pay

## 2017-06-25 DIAGNOSIS — H6982 Other specified disorders of Eustachian tube, left ear: Secondary | ICD-10-CM | POA: Diagnosis not present

## 2017-06-25 DIAGNOSIS — K0889 Other specified disorders of teeth and supporting structures: Secondary | ICD-10-CM

## 2017-06-25 MED ORDER — MELOXICAM 15 MG PO TABS: 15.0000 mg | ORAL_TABLET | Freq: Every day | ORAL | 0 refills | Status: DC

## 2017-06-25 MED ORDER — TRAMADOL HCL 50 MG PO TABS: 50.0000 mg | ORAL_TABLET | Freq: Four times a day (QID) | ORAL | 0 refills | Status: DC | PRN

## 2017-06-25 MED ORDER — FLUTICASONE PROPIONATE 50 MCG/ACT NA SUSP: 1.0000 | Freq: Two times a day (BID) | NASAL | 0 refills | Status: DC

## 2017-06-25 NOTE — ED Triage Notes (Signed)
Presents with pain to inside of mouth  Unsure of dental pain/toothache

## 2017-06-25 NOTE — ED Provider Notes (Signed)
[facility] Emergency Department Provider Note  ____________________________________________  Time seen: Approximately 3:56 PM  I have reviewed the triage vital signs and the nursing notes.   HISTORY  Chief Complaint Facial Pain    HPI Annette White is a 22 y.o. female who presents emergency department complaining of left upper and lower dental pain.  Patient reports that symptoms have been ongoing times 1 day.  Patient reports that she has overall good dentition and has no known cavities, no dental trauma, no appreciable swelling or drainage in the mouth.  Patient reports that she called her dentist and has an appointment in 3 days but she was unable to wait for appointment due to pain.  Patient denies any headache, vision changes, neck pain or stiffness.  Patient denies any nasal congestion or patient has been taking Tylenol and Motrin with no relief.  No other complaints at this time.  History reviewed. No pertinent past medical history.  Patient Active Problem List   Diagnosis Date Noted  . Postpartum care following vaginal delivery 02/25/2015    History reviewed. No pertinent surgical history.  Prior to Admission medications   Medication Sig Start Date End Date Taking? Authorizing Provider  fluticasone (FLONASE) 50 MCG/ACT nasal spray Place 1 spray into both nostrils 2 (two) times daily. 06/25/17   Harley Mccartney, Charline Bills, PA-C  HYDROcodone-acetaminophen (NORCO) 5-325 MG tablet Take 1 tablet by mouth 3 (three) times daily as needed. 05/19/16   Menshew, Dannielle Karvonen, PA-C  ibuprofen (ADVIL,MOTRIN) 600 MG tablet Take 1 tablet (600 mg total) by mouth every 6 (six) hours as needed. Patient not taking: Reported on 03/21/2016 02/27/15   Dalia Heading, CNM  measles, mumps and rubella vaccine (MMR) injection Inject 0.5 mLs into the skin once as needed (if results show rubella non-immune or equivocal). Patient not taking: Reported on 03/21/2016 02/27/15    Dalia Heading, CNM  meloxicam (MOBIC) 15 MG tablet Take 1 tablet (15 mg total) by mouth daily. 06/25/17   Von Quintanar, Charline Bills, PA-C  Prenatal Vit-Fe Fumarate-FA (PRENATAL MULTIVITAMIN) TABS tablet Take 1 tablet by mouth daily at 12 noon. Patient not taking: Reported on 03/21/2016 02/27/15   Dalia Heading, CNM  traMADol (ULTRAM) 50 MG tablet Take 1 tablet (50 mg total) by mouth every 6 (six) hours as needed. 06/25/17   Darthula Desa, Charline Bills, PA-C    Allergies Sulfamethoxazole-trimethoprim and Penicillins  No family history on file.  Social History Social History   Tobacco Use  . Smoking status: Never Smoker  . Smokeless tobacco: Never Used  Substance Use Topics  . Alcohol use: No  . Drug use: Not on file     Review of Systems  Constitutional: No fever/chills Eyes: No visual changes. No discharge ENT: Left-sided upper and lower dental pain. Cardiovascular: no chest pain. Respiratory: no cough. No SOB. Gastrointestinal: No abdominal pain.  No nausea, no vomiting.   Musculoskeletal: Negative for musculoskeletal pain. Skin: Negative for rash, abrasions, lacerations, ecchymosis. Neurological: Negative for headaches, focal weakness or numbness. 10-point ROS otherwise negative.  ____________________________________________   PHYSICAL EXAM:  VITAL SIGNS: ED Triage Vitals  Enc Vitals Group     BP 06/25/17 1517 123/80     Pulse Rate 06/25/17 1517 96     Resp 06/25/17 1517 16     Temp 06/25/17 1517 98.2 F (36.8 C)     Temp Source 06/25/17 1517 Oral     SpO2 06/25/17 1517 99 %     Weight 06/25/17 1508 151  lb (68.5 kg)     Height 06/25/17 1508 '5\' 1"'  (1.549 m)     Head Circumference --      Peak Flow --      Pain Score 06/25/17 1507 10     Pain Loc --      Pain Edu? --      Excl. in [place]? --      Constitutional: Alert and oriented. Well appearing and in no acute distress. Eyes: Conjunctivae are normal. PERRL. EOMI. Head: Atraumatic. ENT:      Ears: EACs  unremarkable bilaterally.  TM on left is bulging but no air-fluid level.      Nose: Minimal clear congestion/rhinnorhea.      Mouth/Throat: Mucous membranes are moist.  Oropharynx is nonerythematous and nonedematous.  Patient has good dentition with no signs of fractures, erosions, caries.  Patient is nontender to palpation over left-sided dentition.  No erythema or edema.  No pustular drainage.  No loose dentition. Neck: No stridor.  Neck is supple full range of motion Hematological/Lymphatic/Immunilogical: No cervical lymphadenopathy. Cardiovascular: Normal rate, regular rhythm. Normal S1 and S2.  Good peripheral circulation. Respiratory: Normal respiratory effort without tachypnea or retractions. Lungs CTAB. Good air entry to the bases with no decreased or absent breath sounds. Musculoskeletal: Full range of motion to all extremities. No gross deformities appreciated. Neurologic:  Normal speech and language. No gross focal neurologic deficits are appreciated.  Skin:  Skin is warm, dry and intact. No rash noted. Psychiatric: Mood and affect are normal. Speech and behavior are normal. Patient exhibits appropriate insight and judgement.   ____________________________________________   LABS (all labs ordered are listed, but only abnormal results are displayed)  Labs Reviewed - No data to display ____________________________________________  EKG   ____________________________________________  RADIOLOGY   No results found.  ____________________________________________    PROCEDURES  Procedure(s) performed:    Procedures    Medications - No data to display   ____________________________________________   INITIAL IMPRESSION / ASSESSMENT AND PLAN / ED COURSE  Pertinent labs & imaging results that were available during my care of the patient were reviewed by me and considered in my medical decision making (see chart for details).  Review of the [place] CSRS was performed in  accordance of the [place] prior to dispensing any controlled drugs.     Patient's diagnosis is consistent with left-sided dental pain and left-sided eustachian tube dysfunction.  Patient presented with left upper and lower dental pain.  Patient has good dentition with no signs of fracture, loose dentition, caries, abscess.  Patient has a Pharmacist, community and is encouraged to follow-up with same.  Incidental finding of mild eustachian tube dysfunction left side.. Patient will be discharged home with prescriptions for Flonase, tramadol, meloxicam.. Patient is to follow up with dentist and/or primary care as needed or otherwise directed. Patient is given ED precautions to return to the ED for any worsening or new symptoms.     ____________________________________________  FINAL CLINICAL IMPRESSION(S) / ED DIAGNOSES  Final diagnoses:  Pain, dental  Dysfunction of left eustachian tube      NEW MEDICATIONS STARTED DURING THIS VISIT:  ED Discharge Orders        Ordered    fluticasone (FLONASE) 50 MCG/ACT nasal spray  2 times daily     06/25/17 1646    meloxicam (MOBIC) 15 MG tablet  Daily     06/25/17 1646    traMADol (ULTRAM) 50 MG tablet  Every 6 hours PRN  06/25/17 1646          This chart was dictated using voice recognition software/Dragon. Despite best efforts to proofread, errors can occur which can change the meaning. Any change was purely unintentional.    Darletta Moll, PA-C 06/25/17 1653    Merlyn Lot, MD 06/25/17 1850

## 2018-01-19 ENCOUNTER — Ambulatory Visit
Admission: EM | Admit: 2018-01-19 | Discharge: 2018-01-19 | Disposition: A | Discharge status: Home / Self Care | Payer: 59 | Attending: Family Medicine | Admitting: Family Medicine

## 2018-01-19 DIAGNOSIS — L03113 Cellulitis of right upper limb: Secondary | ICD-10-CM

## 2018-01-19 DIAGNOSIS — L02413 Cutaneous abscess of right upper limb: Secondary | ICD-10-CM

## 2018-01-19 MED ORDER — DOXYCYCLINE HYCLATE 100 MG PO CAPS
100.0000 mg | ORAL_CAPSULE | Freq: Two times a day (BID) | ORAL | 0 refills | Status: DC
Start: 2018-01-19 — End: 2018-01-23

## 2018-01-19 MED ORDER — LIDOCAINE HCL (PF) 1 % IJ SOLN: 5.0000 mL | Freq: Once | INTRAMUSCULAR | Status: DC

## 2018-01-19 MED ORDER — OXYCODONE-ACETAMINOPHEN 5-325 MG PO TABS: 1.0000 | ORAL_TABLET | Freq: Four times a day (QID) | ORAL | 0 refills | Status: DC | PRN

## 2018-01-19 MED ORDER — MUPIROCIN 2 % EX OINT: TOPICAL_OINTMENT | CUTANEOUS | 0 refills | Status: DC

## 2018-01-19 NOTE — ED Provider Notes (Addendum)
[facility] ____________________________________________  Time seen: Approximately 4:08 PM  I have reviewed the triage vital signs and the nursing notes.   HISTORY  Chief Complaint Wound Infection   HPI Annette White is a 22 y.o. female presenting for evaluation of skin changes to right forearm present gradual over the last 1 week.  States initially she had a very small bump that look like a typical insect bite.  States that the area slowly did get red and began to swell.  Reports of the last 3 days she has had increased swelling quickly with appearance of pus present, no drainage.  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  Denies any recent skin infection.  Denies any injury or trauma.  No tick attachment or bites to her knowledge.  States occasional nausea with pain, stating pain is moderate when touched.  Mild pain now.  Has applied some topical turmeric, no other relieving measures attempted.  Denies vomiting, fevers, abdominal pain, paresthesias, decreased range of motion.  Denies any fall or trauma.  States pain is only at the skin change site.  Denies any other swelling, skin changes, decreased range of motion.  Patient denies any oral or injectable drug use.  Patient states that she has eczema and frequently has dry skin to both elbows and sometimes she scratches.  Denies chest pain, shortness of breath, abdominal pain, dysuria. Denies any history of blood clots.  Denies recent sickness. Denies recent antibiotic use.   Patient's last menstrual period was 12/20/2017 (within days). Denies pregnancy. Reports tetanus immunization is up-to-date and less than 38 years old.  Pertinent past medical history MRSA skin infections  Patient Active Problem List   Diagnosis Date Noted  . Postpartum care following vaginal delivery 02/25/2015    History reviewed. No pertinent surgical history.    Current Facility-Administered  Medications:  .  lidocaine (PF) (XYLOCAINE) 1 % injection 5 mL, 5 mL, Other, Once, Marylene Land, NP  Current Outpatient Medications:  .  doxycycline (VIBRAMYCIN) 100 MG capsule, Take 1 capsule (100 mg total) by mouth 2 (two) times daily., Disp: 20 capsule, Rfl: 0 .  fluticasone (FLONASE) 50 MCG/ACT nasal spray, Place 1 spray into both nostrils 2 (two) times daily., Disp: 16 g, Rfl: 0 .  HYDROcodone-acetaminophen (NORCO) 5-325 MG tablet, Take 1 tablet by mouth 3 (three) times daily as needed., Disp: 10 tablet, Rfl: 0 .  ibuprofen (ADVIL,MOTRIN) 600 MG tablet, Take 1 tablet (600 mg total) by mouth every 6 (six) hours as needed. (Patient not taking: Reported on 03/21/2016), Disp: 50 tablet, Rfl: 0 .  measles, mumps and rubella vaccine (MMR) injection, Inject 0.5 mLs into the skin once as needed (if results show rubella non-immune or equivocal). (Patient not taking: Reported on 03/21/2016), Disp: 1 each, Rfl: 0 .  meloxicam (MOBIC) 15 MG tablet, Take 1 tablet (15 mg total) by mouth daily., Disp: 30 tablet, Rfl: 0 .  mupirocin ointment (BACTROBAN) 2 %, Apply two times a day for 10 days. After packing out, Disp: 22 g, Rfl: 0 .  oxyCODONE-acetaminophen (PERCOCET/ROXICET) 5-325 MG tablet, Take 1 tablet by mouth every 6 (six) hours as needed for severe pain. Do not drive while taking as can cause drowsiness., Disp: 8 tablet, Rfl: 0 .  Prenatal Vit-Fe Fumarate-FA (PRENATAL MULTIVITAMIN) TABS tablet, Take 1 tablet by mouth daily at 12 noon. (Patient not taking: Reported on 03/21/2016), Disp: , Rfl:  .  traMADol (ULTRAM) 50 MG tablet, Take  1 tablet (50 mg total) by mouth every 6 (six) hours as needed., Disp: 10 tablet, Rfl: 0  Allergies Sulfamethoxazole-trimethoprim and Penicillins  family history Denies others at home with similar skin changes or rash.  Social History Social History   Tobacco Use  . Smoking status: Never Smoker  . Smokeless tobacco: Never Used  Substance Use Topics  . Alcohol use:  No  . Drug use: Not on file    Review of Systems Constitutional: No fevers Cardiovascular: Denies chest pain. Respiratory: Denies shortness of breath. Gastrointestinal: No abdominal pain.   Musculoskeletal: Negative for back pain. Skin:As above.  ____________________________________________   PHYSICAL EXAM:  VITAL SIGNS: ED Triage Vitals  Enc Vitals Group     BP 01/19/18 1550 (!) 115/59     Pulse Rate 01/19/18 1550 77     Resp 01/19/18 1550 18     Temp 01/19/18 1550 98.2 F (36.8 C)     Temp Source 01/19/18 1550 Oral     SpO2 01/19/18 1550 96 %     Weight 01/19/18 1553 160 lb (72.6 kg)     Height --      Head Circumference --      Peak Flow --      Pain Score 01/19/18 1553 9     Pain Loc --      Pain Edu? --      Excl. in [place]? --     Constitutional: Alert and oriented. Well appearing and in no acute distress. ENT      Head: Normocephalic and atraumatic. Cardiovascular: Normal rate, regular rhythm. Grossly normal heart sounds.  Good peripheral circulation. Respiratory: Normal respiratory effort without tachypnea nor retractions. Breath sounds are clear and equal bilaterally. No wheezes, rales, rhonchi. Gastrointestinal: Soft and nontender.  Musculoskeletal:  Steady gait.  Bilateral distal radial pulses equal and easily palpated.  Bilateral hand grip strong and equal. Neurologic:  Normal speech and language.  Speech is normal. No gait instability.  Skin:  Skin is warm, dry. Except:    Right anterior AC erythematous fluctuant pointing abscess, moderate tenderness throughout palpation, no circumferential erythema or edema, slightly limited flexion, full extension, right upper extremity otherwise nontender, no paresthesias, normal distal sensation and distal radial pulse.   Psychiatric: Mood and affect are normal. Speech and behavior are normal. Patient exhibits appropriate insight and judgment   ___________________________________________   LABS (all labs ordered  are listed, but only abnormal results are displayed)  Labs Reviewed  AEROBIC CULTURE (SUPERFICIAL SPECIMEN)   ____________________________________________ PROCEDURES Procedures   Procedure(s) performed:  Procedure(s) performed:  Procedure explained and verbal consent obtained. Consent: Verbal consent obtained. Written consent not obtained. Risks and benefits: risks, benefits and alternatives were discussed Patient identity confirmed: verbally with patient and hospital-assigned identification number  Consent given by: patient   I&D abscess Location: right arm Preparation: Patient was prepped and draped in the usual sterile fashion. Anesthesia with 1% Lidocaine 5 mls Irrigation solution: saline and betadine Amount of cleaning: copious Incision made with #11 blade scalpel Large purulent drainage immediately obtained with expression. Sterile forceps used to probe and break up loculations.  1/4 " iodoform gauze used and packed.  Patient tolerate well. Wound well approximated post repair.  dressing applied.  Wound care instructions provided.  Observe for any signs of infection or other problems.     INITIAL IMPRESSION / ASSESSMENT AND PLAN / ED COURSE  Pertinent labs & imaging results that were available during my care of the patient were reviewed  by me and considered in my medical decision making (see chart for details).  Well-appearing patient.  No acute distress.  Patient states that she had an insect bite to right forearm last week that slowly increased in size.  Cellulitis and abscess.  Patient denies any drug use.  Abscess fluctuant and pointing.  I&D performed as above.  Large amount of drainage.  Area packed.  Return to urgent care in 2 to 3 days for packing removing and wound check.  Will treat with oral doxycycline, quantity 8 Percocet given.  Work note given for today and tomorrow.  Counseled regarding any increased pain, increased swelling or worsening concerns proceed  directly to the emergency room.Discussed indication, risks and benefits of medications with patient.  [place] Controlled substance database reviewed and no recent controlled substances documented.  Discussed follow up with Primary care physician this week. Discussed follow up and return parameters including no resolution or any worsening concerns. Patient verbalized understanding and agreed to plan.   ____________________________________________   FINAL CLINICAL IMPRESSION(S) / ED DIAGNOSES  Final diagnoses:  Abscess of right arm  Cellulitis of right upper extremity     ED Discharge Orders        Ordered    doxycycline (VIBRAMYCIN) 100 MG capsule  2 times daily     01/19/18 1638    mupirocin ointment (BACTROBAN) 2 %     01/19/18 1638    oxyCODONE-acetaminophen (PERCOCET/ROXICET) 5-325 MG tablet  Every 6 hours PRN     01/19/18 1639       Note: This dictation was prepared with Dragon dictation along with smaller phrase technology. Any transcriptional errors that result from this process are unintentional.         Marylene Land, NP 01/19/18 1729

## 2018-01-19 NOTE — ED Triage Notes (Signed)
Pt has a bump on her right arm and has been present for 1 week. Did place tumeric on it and has tried to keep it covered. States it started out as a bump. Size of almost a golf ball and is raised with no drainage. Has been taking ibuprofen for pain.

## 2018-01-19 NOTE — Discharge Instructions (Addendum)
Take medication as prescribed. Rest. Drink plenty of fluids. Keep clean. Apply warm compresses.   Return to Urgent care in 2-3 days for packing removal and wound check.  Follow up with your primary care physician this week as needed. Return to Urgent care as needed.  As discussed for any increased pain, pain radiation, numbness, fevers or no improvement proceed directly to the emergency room for further evaluation.

## 2018-01-21 ENCOUNTER — Ambulatory Visit
Admission: EM | Admit: 2018-01-21 | Discharge: 2018-01-21 | Disposition: A | Discharge status: Home / Self Care | Payer: 59 | Attending: Family Medicine | Admitting: Family Medicine

## 2018-01-21 ENCOUNTER — Other Ambulatory Visit: Payer: Self-pay

## 2018-01-21 ENCOUNTER — Encounter: Payer: Self-pay | Admitting: Emergency Medicine

## 2018-01-21 ENCOUNTER — Telehealth: Payer: Self-pay | Admitting: Family Medicine

## 2018-01-21 DIAGNOSIS — Z5189 Encounter for other specified aftercare: Secondary | ICD-10-CM

## 2018-01-21 MED ORDER — FLUCONAZOLE 150 MG PO TABS: 150.0000 mg | ORAL_TABLET | Freq: Every day | ORAL | 1 refills | Status: DC

## 2018-01-21 NOTE — ED Triage Notes (Signed)
Patient in today to follow up from abscess right inner elbow and to have packing removed. Patient also states she thinks she has a yeast infection from the antibiotic.

## 2018-01-21 NOTE — ED Provider Notes (Signed)
[facility]    CSN: [id number] Arrival date & time: 01/21/18  1401     History   Chief Complaint Chief Complaint  Patient presents with  . Wound Check    right inner elbow    HPI Annette White is a 22 y.o. female.   22 yo female seen 2 days ago for arm abscess which was drained here for wound recheck/re-packing. States infection is improving. Denies fevers/chills.   The history is provided by the patient.  Wound Check     History reviewed. No pertinent past medical history.  Patient Active Problem List   Diagnosis Date Noted  . Postpartum care following vaginal delivery 02/25/2015    History reviewed. No pertinent surgical history.  OB History    Gravida  3   Para  3   Term  3   Preterm      AB      Living  3     SAB      TAB      Ectopic      Multiple  0   Live Births  3            Home Medications    Prior to Admission medications   Medication Sig Start Date End Date Taking? Authorizing Provider  doxycycline (VIBRAMYCIN) 100 MG capsule Take 1 capsule (100 mg total) by mouth 2 (two) times daily. 01/19/18  Yes Marylene Land, NP  ibuprofen (ADVIL,MOTRIN) 600 MG tablet Take 1 tablet (600 mg total) by mouth every 6 (six) hours as needed. 02/27/15  Yes Dalia Heading, CNM  oxyCODONE-acetaminophen (PERCOCET/ROXICET) 5-325 MG tablet Take 1 tablet by mouth every 6 (six) hours as needed for severe pain. Do not drive while taking as can cause drowsiness. 01/19/18  Yes Marylene Land, NP  fluticasone (FLONASE) 50 MCG/ACT nasal spray Place 1 spray into both nostrils 2 (two) times daily. 06/25/17   Cuthriell, Charline Bills, PA-C  HYDROcodone-acetaminophen (NORCO) 5-325 MG tablet Take 1 tablet by mouth 3 (three) times daily as needed. 05/19/16   Menshew, Dannielle Karvonen, PA-C  measles, mumps and rubella vaccine (MMR) injection Inject 0.5 mLs into the skin once as needed (if results show rubella non-immune or equivocal). Patient not taking:  Reported on 03/21/2016 02/27/15   Dalia Heading, CNM  meloxicam (MOBIC) 15 MG tablet Take 1 tablet (15 mg total) by mouth daily. 06/25/17   Cuthriell, Charline Bills, PA-C  mupirocin ointment (BACTROBAN) 2 % Apply two times a day for 10 days. After packing out 01/19/18   Marylene Land, NP  Prenatal Vit-Fe Fumarate-FA (PRENATAL MULTIVITAMIN) TABS tablet Take 1 tablet by mouth daily at 12 noon. Patient not taking: Reported on 03/21/2016 02/27/15   Dalia Heading, CNM  traMADol (ULTRAM) 50 MG tablet Take 1 tablet (50 mg total) by mouth every 6 (six) hours as needed. 06/25/17   Cuthriell, Charline Bills, PA-C    Family History Family History  Problem Relation Age of Onset  . Diabetes Mother   . Hypertension Mother   . Hypertension Father     Social History Social History   Tobacco Use  . Smoking status: Never Smoker  . Smokeless tobacco: Never Used  Substance Use Topics  . Alcohol use: No  . Drug use: Never     Allergies   Sulfamethoxazole-trimethoprim and Penicillins   Review of Systems Review of Systems   Physical Exam Triage Vital Signs ED Triage Vitals [01/21/18 1418]  Enc Vitals Group  BP 117/84     Pulse Rate 100     Resp 16     Temp 98.6 F (37 C)     Temp Source Oral     SpO2 100 %     Weight 160 lb (72.6 kg)     Height _0  (1.575 m)     Head Circumference      Peak Flow      Pain Score 8     Pain Loc      Pain Edu?      Excl. in [place]?    No data found.  Updated Vital Signs BP 117/84 (BP Location: Left Arm)   Pulse 100   Temp 98.6 F (37 C) (Oral)   Resp 16   Ht _1  (1.575 m)   Wt 160 lb (72.6 kg)   LMP 12/20/2017   SpO2 100%   BMI 29.26 kg/m   Visual Acuity Right Eye Distance:   Left Eye Distance:   Bilateral Distance:    Right Eye Near:   Left Eye Near:    Bilateral Near:     Physical Exam  Constitutional: She appears well-developed and well-nourished. No distress.  Skin: She is not diaphoretic.  Right antecubital area with packed  wound; mild drainage; mildly tender  Vitals reviewed.    UC Treatments / Results  Labs (all labs ordered are listed, but only abnormal results are displayed) Labs Reviewed - No data to display  EKG None  Radiology No results found.  Procedures Procedures (including critical care time)  Medications Ordered in UC Medications - No data to display  Initial Impression / Assessment and Plan / UC Course  I have reviewed the triage vital signs and the nursing notes.  Pertinent labs & imaging results that were available during my care of the patient were reviewed by me and considered in my medical decision making (see chart for details).      Final Clinical Impressions(s) / UC Diagnoses   Final diagnoses:  Wound check, abscess     Discharge Instructions     Follow up in 2 days for wound recheck    ED Prescriptions    None     1. diagnosis reviewed with patient 2. Packing removed and wound repacked 3. Recommend supportive treatment with warm compresses to area 4. Continue current antibiotic  4. Follow-up in 2 days for wound recheck/packing removal  Controlled Substance Prescriptions [place] Controlled Substance Registry consulted? Not Applicable   Norval Gable, MD 01/21/18 [phone]

## 2018-01-21 NOTE — Discharge Instructions (Signed)
Follow up in 2 days for wound recheck

## 2018-01-21 NOTE — Telephone Encounter (Signed)
rx sent for diflucan

## 2018-01-22 LAB — AEROBIC CULTURE  (SUPERFICIAL SPECIMEN)

## 2018-01-22 LAB — AEROBIC CULTURE W GRAM STAIN (SUPERFICIAL SPECIMEN)

## 2018-01-23 ENCOUNTER — Encounter: Payer: Self-pay | Admitting: Emergency Medicine

## 2018-01-23 ENCOUNTER — Ambulatory Visit
Admission: EM | Admit: 2018-01-23 | Discharge: 2018-01-23 | Disposition: A | Discharge status: Home / Self Care | Payer: 59 | Attending: Emergency Medicine | Admitting: Emergency Medicine

## 2018-01-23 ENCOUNTER — Other Ambulatory Visit: Payer: Self-pay

## 2018-01-23 DIAGNOSIS — Z5189 Encounter for other specified aftercare: Secondary | ICD-10-CM

## 2018-01-23 DIAGNOSIS — Z09 Encounter for follow-up examination after completed treatment for conditions other than malignant neoplasm: Secondary | ICD-10-CM

## 2018-01-23 DIAGNOSIS — R112 Nausea with vomiting, unspecified: Secondary | ICD-10-CM | POA: Diagnosis not present

## 2018-01-23 DIAGNOSIS — R110 Nausea: Secondary | ICD-10-CM | POA: Diagnosis present

## 2018-01-23 MED ORDER — CLINDAMYCIN HCL 150 MG PO CAPS: 450.0000 mg | ORAL_CAPSULE | Freq: Three times a day (TID) | ORAL | 0 refills | Status: AC

## 2018-01-23 MED ORDER — ONDANSETRON HCL 4 MG PO TABS: 4.0000 mg | ORAL_TABLET | Freq: Four times a day (QID) | ORAL | 0 refills | Status: DC

## 2018-01-23 NOTE — Discharge Instructions (Signed)
Daily dressing change, take Zofran for nausea. Avoid smoking Marijuana or cigarettes as this may make your nausea worse. Stop doxycycline, start clindamycin. Please get established with PCP of your choice-you may call your insurance for list of PCP. Return to Er for new or worsening issues.

## 2018-01-23 NOTE — ED Provider Notes (Signed)
[facility]    CSN: [id number] Arrival date & time: 01/23/18  1356     History   Chief Complaint Chief Complaint  Patient presents with  . Wound Check  . Emesis    HPI Annette White is a 22 y.o. female.   Here for wound eval and packing removal s/p I/D on . Pt states every time she takes her antibiotic she vomits 30 minutes after. 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   The history is provided by the patient. No language interpreter was used.    History reviewed. No pertinent past medical history.  Patient Active Problem List   Diagnosis Date Noted  . Encounter for recheck of abscess following incision and drainage 01/23/2018  . Encounter for wound re-check 01/23/2018  . Nausea and vomiting 01/23/2018  . Postpartum care following vaginal delivery 02/25/2015    History reviewed. No pertinent surgical history.  OB History    Gravida  3   Para  3   Term  3   Preterm      AB      Living  3     SAB      TAB      Ectopic      Multiple  0   Live Births  3            Home Medications    Prior to Admission medications   Medication Sig Start Date End Date Taking? Authorizing Provider  fluconazole (DIFLUCAN) 150 MG tablet Take 1 tablet (150 mg total) by mouth daily. 01/21/18  Yes Payton Mccallumonty, [place], MD  fluticasone (FLONASE) 50 MCG/ACT nasal spray Place 1 spray into both nostrils 2 (two) times daily. 06/25/17  Yes Cuthriell, Delorise RoyalsJonathan D, PA-C  ibuprofen (ADVIL,MOTRIN) 600 MG tablet Take 1 tablet (600 mg total) by mouth every 6 (six) hours as needed. 02/27/15  Yes Farrel ConnersGutierrez, Colleen, CNM  meloxicam (MOBIC) 15 MG tablet Take 1 tablet (15 mg total) by mouth daily. 06/25/17  Yes Cuthriell, Delorise RoyalsJonathan D, PA-C  mupirocin ointment (BACTROBAN) 2 % Apply two times a day for 10 days. After packing out 01/19/18  Yes Renford DillsMiller, Lindsey, NP  clindamycin (CLEOCIN) 150 MG capsule Take 3 capsules (450 mg total) by mouth 3 (three)  times daily for 7 days. 01/23/18 01/30/18  Sipriano Fendley, Para MarchJeanette, NP  ondansetron (ZOFRAN) 4 MG tablet Take 1 tablet (4 mg total) by mouth every 6 (six) hours. 01/23/18   Elize Pinon, Para MarchJeanette, NP  Prenatal Vit-Fe Fumarate-FA (PRENATAL MULTIVITAMIN) TABS tablet Take 1 tablet by mouth daily at 12 noon. Patient not taking: Reported on 03/21/2016 02/27/15   Farrel ConnersGutierrez, Colleen, CNM    Family History Family History  Problem Relation Age of Onset  . Diabetes Mother   . Hypertension Mother   . Hypertension Father     Social History Social History   Tobacco Use  . Smoking status: Never Smoker  . Smokeless tobacco: Never Used  Substance Use Topics  . Alcohol use: No  . Drug use: Never     Allergies   Sulfamethoxazole-trimethoprim and Penicillins   Review of Systems Review of Systems  Constitutional: Negative for chills and fever.  HENT: Negative for ear pain and sore throat.   Eyes: Negative for pain and visual disturbance.  Respiratory: Negative for cough and shortness of breath.   Cardiovascular: Negative for chest pain and palpitations.  Gastrointestinal: Negative for abdominal pain and vomiting.  Genitourinary: Negative for dysuria and hematuria.  Musculoskeletal:  Negative for arthralgias and back pain.  Skin: Positive for wound. Negative for color change and rash.  Neurological: Negative for seizures and syncope.  All other systems reviewed and are negative.    Physical Exam Triage Vital Signs ED Triage Vitals  Enc Vitals Group     BP 01/23/18 1407 115/81     Pulse Rate 01/23/18 1407 (!) 101     Resp 01/23/18 1407 16     Temp 01/23/18 1407 98.5 F (36.9 C)     Temp Source 01/23/18 1407 Oral     SpO2 01/23/18 1407 99 %     Weight 01/23/18 1405 160 lb (72.6 kg)     Height 01/23/18 1405 5\' 2"  (1.575 m)     Head Circumference --      Peak Flow --      Pain Score 01/23/18 1404 8     Pain Loc --      Pain Edu? --      Excl. in GC? --    No data found.  Updated Vital  Signs BP 115/81 (BP Location: Left Arm)   Pulse (!) 101   Temp 98.5 F (36.9 C) (Oral)   Resp 16   Ht 5\' 2"  (1.575 m)   Wt 160 lb (72.6 kg)   LMP 12/20/2017 (Approximate)   SpO2 99%   BMI 29.26 kg/m   Visual Acuity Right Eye Distance:   Left Eye Distance:   Bilateral Distance:    Right Eye Near:   Left Eye Near:    Bilateral Near:     Physical Exam  Constitutional: She is oriented to person, place, and time. She appears well-developed and well-nourished. She is active and cooperative. No distress.  Pt appears "high" in exam room-eyes red, half closed, laughing.   HENT:  Head: Normocephalic and atraumatic.  Eyes: Conjunctivae are normal.  Neck: Neck supple.  Cardiovascular: Normal rate, regular rhythm and normal pulses.  No murmur heard. Pulses:      Radial pulses are 2+ on the right side, and 2+ on the left side.  Pulmonary/Chest: Effort normal and breath sounds normal. No respiratory distress.  Abdominal: Soft. There is no tenderness.  Musculoskeletal: Normal range of motion. She exhibits tenderness. She exhibits no edema.  Neurological: She is alert and oriented to person, place, and time. She displays a negative Romberg sign. GCS eye subscore is 4. GCS verbal subscore is 5. GCS motor subscore is 6.  Skin: Skin is warm and dry. Lesion noted. There is erythema.     Psychiatric: She has a normal mood and affect. Her speech is normal and behavior is normal. Thought content normal.  Nursing note and vitals reviewed.    UC Treatments / Results  Labs (all labs ordered are listed, but only abnormal results are displayed) Labs Reviewed - No data to display  EKG None  Radiology No results found.  Procedures Procedures (including critical care time)  Medications Ordered in UC Medications - No data to display  Initial Impression / Assessment and Plan / UC Course  I have reviewed the triage vital signs and the nursing notes.  Pertinent labs & imaging results  that were available during my care of the patient were reviewed by me and considered in my medical decision making (see chart for details).     Packing removed, dressing applied, advised pt to stop doxycycline, changed to clindamycin, also gave Zofran for nausea. Advised to stop smoking marijuana as it has been linked to [facility].  Pt verbalized understanding to this provider. Discussed local PCP and how to obtain one, pt verbalized understanding tot hsi provider.  Final Clinical Impressions(s) / UC Diagnoses   Final diagnoses:  Encounter for recheck of abscess following incision and drainage  Encounter for wound re-check  Nausea and vomiting, intractability of vomiting not specified, unspecified vomiting type     Discharge Instructions     Daily dressing change, take Zofran for nausea. Avoid smoking Marijuana or cigarettes as this may make your nausea worse. Stop doxycycline, start clindamycin. Please get established with PCP of your choice-you may call your insurance for list of PCP. Return to Er for new or worsening issues.    ED Prescriptions    Medication Sig Dispense Auth. Provider   clindamycin (CLEOCIN) 150 MG capsule Take 3 capsules (450 mg total) by mouth 3 (three) times daily for 7 days. 63 capsule Rayette Mogg, NP   ondansetron (ZOFRAN) 4 MG tablet Take 1 tablet (4 mg total) by mouth every 6 (six) hours. 12 tablet Chyrel Taha, Para March, NP     Controlled Substance Prescriptions    Clancy Gourd, NP 01/23/18 1449

## 2018-01-23 NOTE — ED Triage Notes (Signed)
Patient here for f/u from abscess to right inner elbow and to have packing removed.

## 2018-01-25 ENCOUNTER — Telehealth (HOSPITAL_COMMUNITY): Payer: Self-pay

## 2018-01-25 NOTE — Telephone Encounter (Signed)
Culture positive for Staphylococcus.  [facility] attempted to reach patient to assess wound. No answer at this time.

## 2018-01-26 ENCOUNTER — Telehealth (HOSPITAL_COMMUNITY): Payer: Self-pay

## 2018-01-26 NOTE — Telephone Encounter (Signed)
Pt reports taking antibiotics and the site has improved. Pt encouraged to follow up for any worsening symptoms.

## 2018-05-17 ENCOUNTER — Telehealth: Payer: Self-pay | Admitting: Maternal Newborn

## 2018-05-17 NOTE — Telephone Encounter (Signed)
Patient is schedule 05/31/18 for annual and Liletta removal and reinsertion

## 2018-05-18 NOTE — Telephone Encounter (Signed)
Pt had Liletta placed 04/29/15. It is now good for 6 years. Unless she is having a problem with it, it is good until 04/28/2021. However, Patient hasn't been seen since her 1 mo f/u after IUD placed in 2016. She needs an AE.

## 2018-05-18 NOTE — Telephone Encounter (Signed)
Please advise patient about IUD

## 2018-05-18 NOTE — Telephone Encounter (Signed)
Pt advised her Liletta IUD was placed 04/29/15 & is good thru 04/28/21. No need to change out unless she is having problems. Discussed with patient she still needs annual visits for breast & pelvic exam & pap smears. Pt understands & will keep AE scheduled for 05/31/18

## 2018-05-31 ENCOUNTER — Ambulatory Visit: Payer: Self-pay | Admitting: Maternal Newborn

## 2018-06-16 ENCOUNTER — Ambulatory Visit: Payer: Self-pay | Admitting: Maternal Newborn

## 2018-07-25 DIAGNOSIS — L539 Erythematous condition, unspecified: Secondary | ICD-10-CM | POA: Diagnosis present

## 2018-07-25 DIAGNOSIS — J340 Abscess, furuncle and carbuncle of nose: Secondary | ICD-10-CM | POA: Diagnosis not present

## 2018-07-26 ENCOUNTER — Emergency Department
Admission: EM | Admit: 2018-07-26 | Discharge: 2018-07-26 | Disposition: A | Discharge status: Home / Self Care | Payer: 59 | Attending: Emergency Medicine | Admitting: Emergency Medicine

## 2018-07-26 ENCOUNTER — Other Ambulatory Visit: Payer: Self-pay

## 2018-07-26 ENCOUNTER — Encounter: Payer: Self-pay | Admitting: *Deleted

## 2018-07-26 DIAGNOSIS — J340 Abscess, furuncle and carbuncle of nose: Secondary | ICD-10-CM

## 2018-07-26 MED ORDER — CEPHALEXIN 500 MG PO CAPS: 500.0000 mg | ORAL_CAPSULE | Freq: Two times a day (BID) | ORAL | 0 refills | Status: AC

## 2018-07-26 MED ORDER — LORATADINE 10 MG PO TABS: 10.0000 mg | ORAL_TABLET | Freq: Every day | ORAL | 0 refills | Status: DC

## 2018-07-26 MED ORDER — CEPHALEXIN 500 MG PO CAPS
500.0000 mg | ORAL_CAPSULE | Freq: Once | ORAL | Status: AC
  Administered 2018-07-26: 500 mg via ORAL
  Filled 2018-07-26: qty 1

## 2018-07-26 NOTE — ED Notes (Signed)
ABT given to patient. Writer educated patient on possible side effects of ABT.

## 2018-07-26 NOTE — ED Triage Notes (Signed)
Pt has redness and soreness to tip of nose for 4 days.  No injury to nose.  Pt alert.

## 2018-07-26 NOTE — ED Provider Notes (Signed)
[facility] Emergency Department Provider Note __   First MD Initiated Contact with Patient 07/26/18 [phone]     (approximate)  I have reviewed the triage vital signs and the nursing notes.   HISTORY  Chief Complaint Nose Problem    HPI Annette White is a 23 y.o. female presents to the emergency department with a painful sore red area to the tip of her nose x4 days.  Patient denies any fever.  Patient does admit to recent nasal congestion and rhinorrhea.   No past medical history on file.  Patient Active Problem List   Diagnosis Date Noted  . Encounter for recheck of abscess following incision and drainage 01/23/2018  . Encounter for wound re-check 01/23/2018  . Nausea and vomiting 01/23/2018  . Postpartum care following vaginal delivery 02/25/2015    No past surgical history on file.  Prior to Admission medications   Medication Sig Start Date End Date Taking? Authorizing Provider  cephALEXin (KEFLEX) 500 MG capsule Take 1 capsule (500 mg total) by mouth 2 (two) times daily for 10 days. 07/26/18 08/05/18  Darci Current, MD  fluconazole (DIFLUCAN) 150 MG tablet Take 1 tablet (150 mg total) by mouth daily. 01/21/18   Payton Mccallum, MD  fluticasone (FLONASE) 50 MCG/ACT nasal spray Place 1 spray into both nostrils 2 (two) times daily. 06/25/17   Cuthriell, Delorise Royals, PA-C  ibuprofen (ADVIL,MOTRIN) 600 MG tablet Take 1 tablet (600 mg total) by mouth every 6 (six) hours as needed. 02/27/15   Farrel Conners, CNM  loratadine (CLARITIN) 10 MG tablet Take 1 tablet (10 mg total) by mouth daily for 30 days. 07/26/18 08/25/18  Darci Current, MD  meloxicam (MOBIC) 15 MG tablet Take 1 tablet (15 mg total) by mouth daily. 06/25/17   Cuthriell, Delorise Royals, PA-C  mupirocin ointment (BACTROBAN) 2 % Apply two times a day for 10 days. After packing out 01/19/18   Renford Dills, NP  ondansetron (ZOFRAN) 4 MG tablet Take 1 tablet (4 mg total) by mouth every 6 (six)  hours. 01/23/18   Defelice, Para March, NP  Prenatal Vit-Fe Fumarate-FA (PRENATAL MULTIVITAMIN) TABS tablet Take 1 tablet by mouth daily at 12 noon. Patient not taking: Reported on 03/21/2016 02/27/15   Farrel Conners, CNM    Allergies Sulfamethoxazole-trimethoprim and Penicillins  Family History  Problem Relation Age of Onset  . Diabetes Mother   . Hypertension Mother   . Hypertension Father     Social History Social History   Tobacco Use  . Smoking status: Never Smoker  . Smokeless tobacco: Never Used  Substance Use Topics  . Alcohol use: No  . Drug use: Never    Review of Systems Constitutional: No fever/chills Eyes: No visual changes. ENT: No sore throat.  Positive for nasal congestion painful area to the tip of the nose Cardiovascular: Denies chest pain. Respiratory: Denies shortness of breath. Gastrointestinal: No abdominal pain.  No nausea, no vomiting.  No diarrhea.  No constipation. Genitourinary: Negative for dysuria. Musculoskeletal: Negative for neck pain.  Negative for back pain. Integumentary: Negative for rash. Neurological: Negative for headaches, focal weakness or numbness.   ____________________________________________   PHYSICAL EXAM:  VITAL SIGNS: ED Triage Vitals  Enc Vitals Group     BP 07/26/18 0011 112/60     Pulse Rate 07/26/18 0011 88     Resp 07/26/18 0011 20     Temp 07/26/18 0011 97.7 F (36.5 C)     Temp Source 07/26/18 0011 Oral  SpO2 07/26/18 0011 100 %     Weight 07/26/18 0009 74.8 kg (165 lb)     Height 07/26/18 0009 1.549 m (5\' 1" )     Head Circumference --      Peak Flow --      Pain Score 07/26/18 0009 4     Pain Loc --      Pain Edu? --      Excl. in GC? --     Constitutional: Alert and oriented. Well appearing and in no acute distress. Eyes: Conjunctivae are normal.  Head: Atraumatic. Nose: Positive for congestion/rhinnorhea. Mouth/Throat: Mucous membranes are moist.  Oropharynx non-erythematous. Neck: No  stridor.  Cardiovascular: Normal rate, regular rhythm. Good peripheral circulation. Grossly normal heart sounds. Respiratory: Normal respiratory effort.  No retractions. Lungs CTAB. Skin: Uncle noted at the tip of the nose with evidence of scant purulent drainage inside of the left nare.     Procedures   ____________________________________________   INITIAL IMPRESSION / ASSESSMENT AND PLAN / ED COURSE  As part of my medical decision making, I reviewed the following data within the electronic MEDICAL RECORD NUMBER  23 year old female presented with above-stated history and physical exam secondary to a furuncle on the tip of her nose.  Patient given Keflex in the emergency department. ____________________________________________  FINAL CLINICAL IMPRESSION(S) / ED DIAGNOSES  Final diagnoses:  Furuncle of nose     MEDICATIONS GIVEN DURING THIS VISIT:  Medications  cephALEXin (KEFLEX) capsule 500 mg (500 mg Oral Given 07/26/18 0214)     ED Discharge Orders         Ordered    cephALEXin (KEFLEX) 500 MG capsule  2 times daily     07/26/18 0219    loratadine (CLARITIN) 10 MG tablet  Daily     07/26/18 0219           Note:  This document was prepared using Dragon voice recognition software and may include unintentional dictation errors.    Darci Current, MD 07/26/18 [phone]

## 2018-07-26 NOTE — ED Notes (Addendum)
Pt to the er for redness and pain to the end of her nose. Pt states she did have a cold and rubbed her nose a lot. Pt has an open wound in the left nare and has minimal drainage. Pt states she noticed it on Friday. Pt takes benadryl for allergies.

## 2018-08-16 LAB — HM HIV SCREENING LAB: HM HIV Screening: NEGATIVE

## 2018-08-16 LAB — HM HEPATITIS C SCREENING LAB: HM Hepatitis Screen: NEGATIVE

## 2018-08-31 ENCOUNTER — Ambulatory Visit: Payer: Self-pay | Admitting: Obstetrics and Gynecology

## 2018-09-13 ENCOUNTER — Other Ambulatory Visit
Admission: RE | Admit: 2018-09-13 | Discharge: 2018-09-13 | Disposition: A | Discharge status: Home / Self Care | Payer: 59 | Source: Ambulatory Visit | Attending: Family Medicine | Admitting: Family Medicine

## 2018-09-13 DIAGNOSIS — R197 Diarrhea, unspecified: Secondary | ICD-10-CM | POA: Diagnosis present

## 2018-09-13 DIAGNOSIS — R6889 Other general symptoms and signs: Secondary | ICD-10-CM | POA: Diagnosis present

## 2018-09-13 LAB — COMPREHENSIVE METABOLIC PANEL
ALT: 15 U/L (ref 0–44)
ANION GAP: 6 (ref 5–15)
AST: 20 U/L (ref 15–41)
Albumin: 4.7 g/dL (ref 3.5–5.0)
Alkaline Phosphatase: 42 U/L (ref 38–126)
BUN: 10 mg/dL (ref 6–20)
CALCIUM: 9.2 mg/dL (ref 8.9–10.3)
CHLORIDE: 106 mmol/L (ref 98–111)
CO2: 26 mmol/L (ref 22–32)
CREATININE: 0.62 mg/dL (ref 0.44–1.00)
Glucose, Bld: 96 mg/dL (ref 70–99)
Potassium: 3.4 mmol/L — ABNORMAL LOW (ref 3.5–5.1)
SODIUM: 138 mmol/L (ref 135–145)
Total Bilirubin: 0.8 mg/dL (ref 0.3–1.2)
Total Protein: 7.6 g/dL (ref 6.5–8.1)

## 2019-05-10 ENCOUNTER — Ambulatory Visit: Payer: Self-pay

## 2019-05-15 ENCOUNTER — Ambulatory Visit: Payer: Self-pay

## 2019-06-02 ENCOUNTER — Other Ambulatory Visit: Payer: Self-pay

## 2019-06-02 ENCOUNTER — Ambulatory Visit
Admission: EM | Admit: 2019-06-02 | Discharge: 2019-06-02 | Disposition: A | Discharge status: Home / Self Care | Payer: 59 | Attending: Family Medicine | Admitting: Family Medicine

## 2019-06-02 ENCOUNTER — Encounter: Payer: Self-pay | Admitting: Emergency Medicine

## 2019-06-02 DIAGNOSIS — Z113 Encounter for screening for infections with a predominantly sexual mode of transmission: Secondary | ICD-10-CM

## 2019-06-02 DIAGNOSIS — B9689 Other specified bacterial agents as the cause of diseases classified elsewhere: Secondary | ICD-10-CM

## 2019-06-02 DIAGNOSIS — N760 Acute vaginitis: Secondary | ICD-10-CM

## 2019-06-02 LAB — URINALYSIS, COMPLETE (UACMP) WITH MICROSCOPIC
Bilirubin Urine: NEGATIVE
Glucose, UA: NEGATIVE mg/dL
Ketones, ur: NEGATIVE mg/dL
Leukocytes,Ua: NEGATIVE
Nitrite: NEGATIVE
Protein, ur: NEGATIVE mg/dL
Specific Gravity, Urine: 1.015 (ref 1.005–1.030)
pH: 6 (ref 5.0–8.0)

## 2019-06-02 LAB — WET PREP, GENITAL
Sperm: NONE SEEN
Trich, Wet Prep: NONE SEEN
Yeast Wet Prep HPF POC: NONE SEEN

## 2019-06-02 MED ORDER — METRONIDAZOLE 500 MG PO TABS: 500.0000 mg | ORAL_TABLET | Freq: Two times a day (BID) | ORAL | 0 refills | Status: DC

## 2019-06-02 NOTE — ED Provider Notes (Signed)
[facility]    CSN: [id number] Arrival date & time: 06/02/19  1620  History   Chief Complaint Chief Complaint  Patient presents with  . Vaginal Discharge  . Vaginal Itching   HPI  23 year old female presents with the above complaints.  Patient reports vaginal itching for the past week.  Developed vaginal discharge yesterday.  Patient reports that she has used some over-the-counter Monistat without resolution.  She states that she is having unprotected intercourse and is concerned about the possibility of STD.  She reports current yellow vaginal discharge.  Denies abdominal pain.  Denies fever.  No other medications or interventions tried.  No other complaints at this time.  PMH, Surgical Hx, Family Hx, Social History reviewed and updated as below.  No significant PMH.  No surgical hx.  OB History    Gravida  3   Para  3   Term  3   Preterm      AB      Living  3     SAB      TAB      Ectopic      Multiple  0   Live Births  3            Home Medications    Prior to Admission medications   Medication Sig Start Date End Date Taking? Authorizing Provider  levonorgestrel (MIRENA) 20 MCG/24HR IUD 1 each by Intrauterine route once.   Yes [provider]  loratadine (CLARITIN) 10 MG tablet Take 1 tablet (10 mg total) by mouth daily for 30 days. 07/26/18 08/25/18  Gregor Hams, MD  metroNIDAZOLE (FLAGYL) 500 MG tablet Take 1 tablet (500 mg total) by mouth 2 (two) times daily. 06/02/19   Coral Spikes, DO  fluticasone (FLONASE) 50 MCG/ACT nasal spray Place 1 spray into both nostrils 2 (two) times daily. 06/25/17 06/02/19  Cuthriell, Charline Bills, PA-C    Family History Family History  Problem Relation Age of Onset  . Diabetes Mother   . Hypertension Mother   . Hypertension Father   . Diabetes Maternal Grandfather   . Prostate cancer Maternal Grandfather     Social History Social History   Tobacco Use  . Smoking status: Never  Smoker  . Smokeless tobacco: Never Used  Substance Use Topics  . Alcohol use: No  . Drug use: Never     Allergies   Sulfamethoxazole-trimethoprim and Penicillins   Review of Systems Review of Systems  Constitutional: Negative.   Gastrointestinal: Negative.   Genitourinary: Positive for vaginal discharge.       Vaginal itching.   Physical Exam Triage Vital Signs ED Triage Vitals  Enc Vitals Group     BP 06/02/19 1718 118/82     Pulse Rate 06/02/19 1718 74     Resp 06/02/19 1718 16     Temp 06/02/19 1718 98.1 F (36.7 C)     Temp Source 06/02/19 1718 Oral     SpO2 06/02/19 1718 100 %     Weight 06/02/19 1715 170 lb (77.1 kg)     Height 06/02/19 1715 5\' 1"  (1.549 m)     Head Circumference --      Peak Flow --      Pain Score 06/02/19 1715 0     Pain Loc --      Pain Edu? --      Excl. in [place]? --    Updated Vital Signs BP 118/82 (BP Location: Right Arm)  Pulse 74   Temp 98.1 F (36.7 C) (Oral)   Resp 16   Ht 5\' 1"  (1.549 m)   Wt 77.1 kg   LMP 04/23/2019 (Approximate)   SpO2 100%   BMI 32.12 kg/m   Visual Acuity Right Eye Distance:   Left Eye Distance:   Bilateral Distance:    Right Eye Near:   Left Eye Near:    Bilateral Near:     Physical Exam Vitals and nursing note reviewed.  Constitutional:      General: She is not in acute distress.    Appearance: Normal appearance. She is not ill-appearing.  HENT:     Head: Normocephalic and atraumatic.  Eyes:     General:        Right eye: No discharge.        Left eye: No discharge.     Conjunctiva/sclera: Conjunctivae normal.  Cardiovascular:     Rate and Rhythm: Normal rate and regular rhythm.     Heart sounds: No murmur.  Pulmonary:     Effort: Pulmonary effort is normal.     Breath sounds: Normal breath sounds. No wheezing, rhonchi or rales.  Abdominal:     General: There is no distension.     Palpations: Abdomen is soft.     Tenderness: There is no abdominal tenderness.  Neurological:      Mental Status: She is alert.  Psychiatric:        Mood and Affect: Mood normal.        Behavior: Behavior normal.    UC Treatments / Results  Labs (all labs ordered are listed, but only abnormal results are displayed) Labs Reviewed  WET PREP, GENITAL - Abnormal; Notable for the following components:      Result Value   Clue Cells Wet Prep HPF POC PRESENT (*)    WBC, Wet Prep HPF POC MODERATE (*)    All other components within normal limits  URINALYSIS, COMPLETE (UACMP) WITH MICROSCOPIC - Abnormal; Notable for the following components:   Hgb urine dipstick TRACE (*)    Bacteria, UA FEW (*)    All other components within normal limits  GC/CHLAMYDIA PROBE AMP    EKG   Radiology No results found.  Procedures Procedures (including critical care time)  Medications Ordered in UC Medications - No data to display  Initial Impression / Assessment and Plan / UC Course  I have reviewed the triage vital signs and the nursing notes.  Pertinent labs & imaging results that were available during my care of the patient were reviewed by me and considered in my medical decision making (see chart for details).    23 year old female presents with bacterial vaginosis.  Awaiting STD results.  Flagyl as directed.  Supportive care.  Final Clinical Impressions(s) / UC Diagnoses   Final diagnoses:  BV (bacterial vaginosis)  Screening for STD (sexually transmitted disease)     Discharge Instructions     Awaiting STD testing results.  Antibiotic as prescribed.  Take care  Dr. 22    ED Prescriptions    Medication Sig Dispense Auth. Provider   metroNIDAZOLE (FLAGYL) 500 MG tablet Take 1 tablet (500 mg total) by mouth 2 (two) times daily. 14 tablet Adriana Simas, DO     PDMP not reviewed this encounter.   Tommie Sams, Tommie Sams 06/02/19 1958

## 2019-06-02 NOTE — Discharge Instructions (Signed)
Awaiting STD testing results.  Antibiotic as prescribed.  Take care  Dr. Lacinda Axon

## 2019-06-02 NOTE — ED Triage Notes (Signed)
Patient c/o vaginal discharge and itching that started a week ago.  Patient also wants to be tested for GC/Chlamydia.

## 2019-06-05 LAB — GC/CHLAMYDIA PROBE AMP
Chlamydia trachomatis, NAA: NEGATIVE
Neisseria Gonorrhoeae by PCR: NEGATIVE

## 2020-04-20 ENCOUNTER — Emergency Department: Payer: 59

## 2020-04-20 ENCOUNTER — Emergency Department
Admission: EM | Admit: 2020-04-20 | Discharge: 2020-04-20 | Disposition: A | Discharge status: Home / Self Care | Payer: 59 | Attending: Emergency Medicine | Admitting: Emergency Medicine

## 2020-04-20 ENCOUNTER — Encounter: Payer: Self-pay | Admitting: Emergency Medicine

## 2020-04-20 ENCOUNTER — Other Ambulatory Visit: Payer: Self-pay

## 2020-04-20 DIAGNOSIS — R509 Fever, unspecified: Secondary | ICD-10-CM | POA: Diagnosis present

## 2020-04-20 DIAGNOSIS — Z20822 Contact with and (suspected) exposure to covid-19: Secondary | ICD-10-CM | POA: Insufficient documentation

## 2020-04-20 DIAGNOSIS — J020 Streptococcal pharyngitis: Secondary | ICD-10-CM | POA: Insufficient documentation

## 2020-04-20 LAB — CBC WITH DIFFERENTIAL/PLATELET
Abs Immature Granulocytes: 0.05 10*3/uL (ref 0.00–0.07)
Basophils Absolute: 0 10*3/uL (ref 0.0–0.1)
Basophils Relative: 0 %
Eosinophils Absolute: 0 10*3/uL (ref 0.0–0.5)
Eosinophils Relative: 0 %
HCT: 38.1 % (ref 36.0–46.0)
Hemoglobin: 12.9 g/dL (ref 12.0–15.0)
Immature Granulocytes: 0 %
Lymphocytes Relative: 6 %
Lymphs Abs: 0.9 10*3/uL (ref 0.7–4.0)
MCH: 29.9 pg (ref 26.0–34.0)
MCHC: 33.9 g/dL (ref 30.0–36.0)
MCV: 88.2 fL (ref 80.0–100.0)
Monocytes Absolute: 0.6 10*3/uL (ref 0.1–1.0)
Monocytes Relative: 4 %
Neutro Abs: 13.5 10*3/uL — ABNORMAL HIGH (ref 1.7–7.7)
Neutrophils Relative %: 90 %
Platelets: 255 10*3/uL (ref 150–400)
RBC: 4.32 MIL/uL (ref 3.87–5.11)
RDW: 12.1 % (ref 11.5–15.5)
WBC: 15.1 10*3/uL — ABNORMAL HIGH (ref 4.0–10.5)
nRBC: 0 % (ref 0.0–0.2)

## 2020-04-20 LAB — URINALYSIS, COMPLETE (UACMP) WITH MICROSCOPIC
Bacteria, UA: NONE SEEN
Bilirubin Urine: NEGATIVE
Glucose, UA: NEGATIVE mg/dL
Ketones, ur: 5 mg/dL — AB
Leukocytes,Ua: NEGATIVE
Nitrite: NEGATIVE
Protein, ur: NEGATIVE mg/dL
Specific Gravity, Urine: 1.028 (ref 1.005–1.030)
pH: 6 (ref 5.0–8.0)

## 2020-04-20 LAB — COMPREHENSIVE METABOLIC PANEL
ALT: 15 U/L (ref 0–44)
AST: 20 U/L (ref 15–41)
Albumin: 4.8 g/dL (ref 3.5–5.0)
Alkaline Phosphatase: 47 U/L (ref 38–126)
Anion gap: 8 (ref 5–15)
BUN: 11 mg/dL (ref 6–20)
CO2: 22 mmol/L (ref 22–32)
Calcium: 9.3 mg/dL (ref 8.9–10.3)
Chloride: 104 mmol/L (ref 98–111)
Creatinine, Ser: 0.7 mg/dL (ref 0.44–1.00)
GFR, Estimated: >60 mL/min (ref >60)
Glucose, Bld: 111 mg/dL — ABNORMAL HIGH (ref 70–99)
Potassium: 3.7 mmol/L (ref 3.5–5.1)
Sodium: 134 mmol/L — ABNORMAL LOW (ref 135–145)
Total Bilirubin: 1.1 mg/dL (ref 0.3–1.2)
Total Protein: 8 g/dL (ref 6.5–8.1)

## 2020-04-20 LAB — PROTIME-INR
INR: 1.1 (ref 0.8–1.2)
Prothrombin Time: 13.4 seconds (ref 11.4–15.2)

## 2020-04-20 LAB — GROUP A STREP BY PCR: Group A Strep by PCR: DETECTED — AB

## 2020-04-20 LAB — RESPIRATORY PANEL BY RT PCR (FLU A&B, COVID)
Influenza A by PCR: NEGATIVE
Influenza B by PCR: NEGATIVE
SARS Coronavirus 2 by RT PCR: NEGATIVE

## 2020-04-20 LAB — POC URINE PREG, ED: Preg Test, Ur: NEGATIVE

## 2020-04-20 LAB — LACTIC ACID, PLASMA: Lactic Acid, Venous: 1 mmol/L (ref 0.5–1.9)

## 2020-04-20 LAB — TROPONIN I (HIGH SENSITIVITY): Troponin I (High Sensitivity): 4 ng/L (ref <18)

## 2020-04-20 LAB — MONONUCLEOSIS SCREEN: Mono Screen: NEGATIVE

## 2020-04-20 MED ORDER — SODIUM CHLORIDE 0.9 % IV BOLUS
1000.0000 mL | Freq: Once | INTRAVENOUS | Status: AC
  Administered 2020-04-20: 1000 mL via INTRAVENOUS

## 2020-04-20 MED ORDER — IBUPROFEN 800 MG PO TABS
800.0000 mg | ORAL_TABLET | Freq: Once | ORAL | Status: AC
  Administered 2020-04-20: 800 mg via ORAL
  Filled 2020-04-20: qty 1

## 2020-04-20 MED ORDER — AZITHROMYCIN 500 MG PO TABS
500.0000 mg | ORAL_TABLET | Freq: Every day | ORAL | 0 refills | Status: AC
Start: 2020-04-20 — End: 2020-04-24

## 2020-04-20 MED ORDER — KETOROLAC TROMETHAMINE 30 MG/ML IJ SOLN
15.0000 mg | Freq: Once | INTRAMUSCULAR | Status: AC
  Administered 2020-04-20: 15 mg via INTRAVENOUS
  Filled 2020-04-20: qty 1

## 2020-04-20 MED ORDER — AZITHROMYCIN 500 MG PO TABS
500.0000 mg | ORAL_TABLET | Freq: Once | ORAL | Status: AC
  Administered 2020-04-20: 500 mg via ORAL
  Filled 2020-04-20: qty 1

## 2020-04-20 MED ORDER — DEXAMETHASONE SODIUM PHOSPHATE 10 MG/ML IJ SOLN
10.0000 mg | Freq: Once | INTRAMUSCULAR | Status: AC
  Administered 2020-04-20: 10 mg via INTRAVENOUS
  Filled 2020-04-20: qty 1

## 2020-04-20 MED ORDER — ACETAMINOPHEN 500 MG PO TABS
1000.0000 mg | ORAL_TABLET | ORAL | Status: AC
  Administered 2020-04-20: 1000 mg via ORAL
  Filled 2020-04-20: qty 2

## 2020-04-20 NOTE — ED Provider Notes (Signed)
[facility] Emergency Department Provider Note   ____________________________________________   First MD Initiated Contact with Patient 04/20/20 [phone]     (approximate)  I have reviewed the triage vital signs and the nursing notes.   HISTORY  Chief Complaint Fever    HPI Annette White is a 24 y.o. female reports no significant past medical history.  Denies pregnancy has IUD    Patient reports that yesterday she was at work started to feel achy and feverish.  She had a little bit of crampy abdominal discomfort that went away.  She then went home and noticed that she was having a sore throat headaches body aches and fatigue.  Does not feel like she can go to work today, came concerned that she could have "Covid"  No nausea vomiting.  No chest pain or trouble breathing except she reports a little bit of an achiness in her chest, has also had some very slight episodes of procedures describes as feelings of stress in her chest for the last 2 months   No relief of symptoms after taking over-the-counter medicine last night  History reviewed. No pertinent past medical history.  Patient Active Problem List   Diagnosis Date Noted  . Encounter for recheck of abscess following incision and drainage 01/23/2018  . Encounter for wound re-check 01/23/2018  . Nausea and vomiting 01/23/2018  . Postpartum care following vaginal delivery 02/25/2015    History reviewed. No pertinent surgical history.  Prior to Admission medications   Medication Sig Start Date End Date Taking? Authorizing Provider  azithromycin (ZITHROMAX) 500 MG tablet Take 1 tablet (500 mg total) by mouth daily for 4 days. First dose 04/21/2020 04/20/20 04/24/20  Sharyn Creamer, MD  levonorgestrel (MIRENA) 20 MCG/24HR IUD 1 each by Intrauterine route once.    [provider]  loratadine (CLARITIN) 10 MG tablet Take 1 tablet (10 mg total) by mouth daily for 30 days. 07/26/18 08/25/18  Darci Current, MD  metroNIDAZOLE (FLAGYL) 500 MG tablet Take 1 tablet (500 mg total) by mouth 2 (two) times daily. 06/02/19   Tommie Sams, DO  fluticasone (FLONASE) 50 MCG/ACT nasal spray Place 1 spray into both nostrils 2 (two) times daily. 06/25/17 06/02/19  Cuthriell, Delorise Royals, PA-C    Allergies Sulfamethoxazole-trimethoprim and Penicillins  Family History  Problem Relation Age of Onset  . Diabetes Mother   . Hypertension Mother   . Hypertension Father   . Diabetes Maternal Grandfather   . Prostate cancer Maternal Grandfather     Social History Social History   Tobacco Use  . Smoking status: Never Smoker  . Smokeless tobacco: Never Used  Vaping Use  . Vaping Use: Former  Substance Use Topics  . Alcohol use: No  . Drug use: Yes    Types: Marijuana    Review of Systems Constitutional: Fevers chills and body aches eyes: No visual changes. ENT: Sore throat with swallowing.  Not having trouble swallowing or breathing Cardiovascular: Denies chest pain except reports a bit of an achy feeling. Respiratory: Denies shortness of breath. Gastrointestinal: No abdominal pain now but had a little crampy abdominal pain yesterday.   Genitourinary: Negative for dysuria.  Some very slight vaginal spotting yesterday Musculoskeletal: Negative for back pain she reports her muscles seem very achy in general. Skin: Negative for rash. Neurological: Negative for areas of focal weakness or numbness.  Mild headache starting about the same time as her symptoms.    ____________________________________________   PHYSICAL EXAM:  VITAL SIGNS: ED Triage Vitals  Enc Vitals Group     BP 04/20/20 0351 117/61     Pulse Rate 04/20/20 0351 (!) 121     Resp 04/20/20 0351 18     Temp 04/20/20 0351 (!) 101.3 F (38.5 C)     Temp Source 04/20/20 0351 Oral     SpO2 04/20/20 0351 99 %     Weight 04/20/20 0355 175 lb (79.4 kg)     Height 04/20/20 0355 5\' 1"  (1.549 m)     Head Circumference --       Peak Flow --      Pain Score 04/20/20 0355 8     Pain Loc --      Pain Edu? --      Excl. in GC? --     Constitutional: Alert and oriented. Well appearing and in no acute distress.  Does appear mildly ill however but very nontoxic. Eyes: Conjunctivae are normal. Head: Atraumatic. Nose: No congestion/rhinnorhea. Mouth/Throat: Mucous membranes are moist.  Posterior oropharynx is injected, she has moderate bilateral tonsillar hypertrophy and exudates.  There is no evidence of mass-effect or evidence to suggest abscess.  Airway is patent. Neck: No stridor.  No meningismus Cardiovascular: Mildly tachycardic rate, regular rhythm. Grossly normal heart sounds.  Good peripheral circulation. Respiratory: Normal respiratory effort.  No retractions. Lungs CTAB. Gastrointestinal: Soft and nontender. No distention. Musculoskeletal: No lower extremity tenderness nor edema. Neurologic:  Normal speech and language. No gross focal neurologic deficits are appreciated.  Skin:  Skin is warm, dry and intact. No rash noted. Psychiatric: Mood and affect are normal. Speech and behavior are normal.  ____________________________________________   LABS (all labs ordered are listed, but only abnormal results are displayed)  Labs Reviewed  GROUP A STREP BY PCR - Abnormal; Notable for the following components:      Result Value   Group A Strep by PCR DETECTED (*)    All other components within normal limits  COMPREHENSIVE METABOLIC PANEL - Abnormal; Notable for the following components:   Sodium 134 (*)    Glucose, Bld 111 (*)    All other components within normal limits  CBC WITH DIFFERENTIAL/PLATELET - Abnormal; Notable for the following components:   WBC 15.1 (*)    Neutro Abs 13.5 (*)    All other components within normal limits  URINALYSIS, COMPLETE (UACMP) WITH MICROSCOPIC - Abnormal; Notable for the following components:   Color, Urine YELLOW (*)    APPearance HAZY (*)    Hgb urine dipstick LARGE  (*)    Ketones, ur 5 (*)    All other components within normal limits  RESPIRATORY PANEL BY RT PCR (FLU A&B, COVID)  LACTIC ACID, PLASMA  PROTIME-INR  LACTIC ACID, PLASMA  MONONUCLEOSIS SCREEN  POC URINE PREG, ED  TROPONIN I (HIGH SENSITIVITY)  TROPONIN I (HIGH SENSITIVITY)   ____________________________________________  EKG  Reviewed interpreted by me at 4 AM Heart rate 120 QRS 99 QTc 400 Sinus tachycardia, no evidence of acute ischemia ____________________________________________  RADIOLOGY  DG Chest 2 View  Result Date: 04/20/2020 CLINICAL DATA:  Sepsis EXAM: CHEST - 2 VIEW COMPARISON:  06/30/2013 FINDINGS: The heart size and mediastinal contours are within normal limits. Both lungs are clear. The visualized skeletal structures are unremarkable. IMPRESSION: No active cardiopulmonary disease. Electronically Signed   By: 08/28/2013 MD   On: 04/20/2020 04:50     Chest x-ray reviewed and negative for acute findings. ____________________________________________   PROCEDURES  Procedure(s) performed:  None  Procedures  Critical Care performed: No  ____________________________________________   INITIAL IMPRESSION / ASSESSMENT AND PLAN / ED COURSE  Pertinent labs & imaging results that were available during my care of the patient were reviewed by me and considered in my medical decision making (see chart for details).   Labs demonstrate mild leukocytosis.  Examination concerning for pharyngitis possible strep.  Will test for.  Additionally her chemistry is reassuring.  Lactic acid normal.  She is very nontoxic in appearance but is tachycardic on presentation and febrile.  I suspect however given her age and my clinical findings this is likely pharyngitis of potentially a strep type symptomatology or also would consider mononucleosis viral, or other abnormal bacterial etiology though discussion with her it appears she has 1 sexual partner of many years, and does not have  significant risk factors for STD gonorrhea/chalmydia pharyngitis, etc. reassuring exam.  No evidence of abscess.  No associate abdominal symptoms on examination at this time, no pain or discomfort.  Reassuring work-up    ----------------------------------------- 10:20 AM on 04/20/2020 -----------------------------------------  After treatments patient feeling much improved.  Resting comfortably.  Heart rate normalized.  Mild hypotension but in the setting of her age young status normal heart rate and the fact that she is up ambulatory and feeling well I do not believe this represents sepsis rather I suspect this is most representative of strep tonsillitis and will treat as such.  Does report penicillin allergy will treat with azithromycin.  Discussed careful return precautions with the patient is in agreement with plan  ____________________________________________   FINAL CLINICAL IMPRESSION(S) / ED DIAGNOSES  Final diagnoses:  Strep throat        Note:  This document was prepared using Dragon voice recognition software and may include unintentional dictation errors       Sharyn Creamer, MD 04/20/20 1021

## 2020-04-20 NOTE — ED Triage Notes (Signed)
Patient with complaint of chest pain times two months that she states has become worse. Patient states that she started running a fever and headache at 09:00 this morning.

## 2020-04-20 NOTE — ED Notes (Signed)
Patient verbalizes understanding of discharge instructions. Opportunity for questioning and answers were provided. Armband removed by staff, pt discharged from ED. Ambulated out to lobby  

## 2020-07-23 ENCOUNTER — Ambulatory Visit: Payer: Self-pay | Admitting: Family Medicine

## 2020-07-23 ENCOUNTER — Ambulatory Visit: Payer: Self-pay

## 2020-07-23 ENCOUNTER — Encounter: Payer: Self-pay | Admitting: Family Medicine

## 2020-07-23 ENCOUNTER — Other Ambulatory Visit: Payer: Self-pay

## 2020-07-23 DIAGNOSIS — Z113 Encounter for screening for infections with a predominantly sexual mode of transmission: Secondary | ICD-10-CM

## 2020-07-23 DIAGNOSIS — N898 Other specified noninflammatory disorders of vagina: Secondary | ICD-10-CM

## 2020-07-23 DIAGNOSIS — A599 Trichomoniasis, unspecified: Secondary | ICD-10-CM

## 2020-07-23 DIAGNOSIS — F1290 Cannabis use, unspecified, uncomplicated: Secondary | ICD-10-CM

## 2020-07-23 MED ORDER — METRONIDAZOLE 500 MG PO TABS: 500.0000 mg | ORAL_TABLET | Freq: Two times a day (BID) | ORAL | 0 refills | Status: DC

## 2020-07-23 NOTE — Progress Notes (Signed)
[facility] Department STI clinic/screening visit  Subjective:  Annette White is a 25 y.o. female being seen today for an STI screening visit. The patient reports they do have symptoms.  Patient reports that they do not desire a pregnancy in the next year.   They reported they are interested in discussing contraception today.  Patient's last menstrual period was 07/16/2020.   Patient has the following medical conditions:   Patient Active Problem List   Diagnosis Date Noted   Nausea and vomiting 01/23/2018   Eczema 07/17/2014   Allergic rhinitis due to allergen 07/17/2014    Chief Complaint  Patient presents with   Exposure to STD    HPI  Patient reports recently told by MD she had trich 12/30-- tested On 1/3 called by MD told she had trich  Having minor itching   Last HIV test per patient/review of record was  Patient reports last pap was a long time ago- need pap smear  IUD placed 5.5 years-- [place]. Likes method but is worried it has "expired"  See flowsheet for further details and programmatic requirements.    The following portions of the patient's history were reviewed and updated as appropriate: allergies, current medications, past medical history, past social history, past surgical history and problem list.  Objective:  There were no vitals filed for this visit.  Physical Exam Vitals and nursing note reviewed.  Constitutional:      Appearance: Normal appearance.  HENT:     Head: Normocephalic and atraumatic.     Mouth/Throat:     Mouth: Mucous membranes are moist.     Pharynx: Oropharynx is clear. No oropharyngeal exudate or posterior oropharyngeal erythema.  Pulmonary:     Effort: Pulmonary effort is normal.  Chest:  Breasts:     Right: No axillary adenopathy or supraclavicular adenopathy.     Left: No axillary adenopathy or supraclavicular adenopathy.    Abdominal:     General: Abdomen is flat.     Palpations: There is no  mass.     Tenderness: There is no abdominal tenderness. There is no rebound.  Genitourinary:    General: Normal vulva.     Exam position: Lithotomy position.     Pubic Area: No rash or pubic lice.      Labia:        Right: No rash or lesion.        Left: No rash or lesion.      Vagina: Vaginal discharge (thin/creamy yellow. pH>4.5) present. No erythema, bleeding or lesions.     Cervix: No cervical motion tenderness, discharge, friability, lesion or erythema.     Uterus: Normal.      Adnexa: Right adnexa normal and left adnexa normal.     Rectum: Normal.  Lymphadenopathy:     Head:     Right side of head: No preauricular or posterior auricular adenopathy.     Left side of head: No preauricular or posterior auricular adenopathy.     Cervical: No cervical adenopathy.     Upper Body:     Right upper body: No supraclavicular or axillary adenopathy.     Left upper body: No supraclavicular or axillary adenopathy.     Lower Body: No right inguinal adenopathy. No left inguinal adenopathy.  Skin:    General: Skin is warm and dry.     Findings: No rash.  Neurological:     Mental Status: She is alert and oriented to person, place, and time.  Assessment and Plan:  Annette White is a 25 y.o. female presenting to the [facility] Department for STI screening  1. Screening examination for venereal disease Treat per standing Recommend no sex until partners treated Reviewed timeline for results to return - Syphilis Serology, [place] [facility] - WET PREP FOR TRICH, YEAST, CLUE - Chlamydia/Gonorrhea [place] [facility] - Hepatitis Serology, [place] [facility] - HIV/HCV [place] [facility] - Chlamydia/Gonorrhea [place] [facility] - metroNIDAZOLE (FLAGYL) 500 MG tablet; Take 1 tablet (500 mg total) by mouth 2 (two) times daily.  Dispense: 14 tablet; Refill: 0  2. Marijuana use - Hepatitis Serology, [place] [facility] - HIV/HCV [place] [facility]  3. Vaginal discharge See below  4. Trichomonas  infection Treat for trich given sex with same partner prior to knowing she had trich and sx.  - metroNIDAZOLE (FLAGYL) 500 MG tablet; Take 1 tablet (500 mg total) by mouth 2 (two) times daily.  Dispense: 14 tablet; Refill: 0  5.Family planning - Rutha Bouchard is likely still working, reviewed data for extended use of Mirena/Liletta. Reviewed recommendation for removal reinsertion in this 5th year.  - recommended FP visit with IUD removal/reinsertion.    Return in about 4 weeks (around 08/20/2020) for Family planning visit with IUD removal/Reinsertion (liletta/mirena).  No future appointments.  Federico Flake, MD

## 2020-07-23 NOTE — Progress Notes (Unsigned)
Wet mount reviewed, pt treated with metronidazole per provider orders. Provider orders completed.

## 2020-07-24 LAB — WET PREP FOR TRICH, YEAST, CLUE
Trichomonas Exam: NEGATIVE
Yeast Exam: NEGATIVE

## 2020-07-31 ENCOUNTER — Encounter: Payer: Self-pay | Admitting: Student

## 2020-07-31 LAB — HM HIV SCREENING LAB: HM HIV Screening: NEGATIVE

## 2020-07-31 LAB — HM HEPATITIS C SCREENING LAB: HM Hepatitis Screen: NEGATIVE

## 2020-08-13 ENCOUNTER — Encounter: Payer: Self-pay | Admitting: Student

## 2020-08-13 LAB — HEPATITIS B SURFACE ANTIGEN: Hepatitis B Surface Ag: NONREACTIVE

## 2021-04-06 ENCOUNTER — Other Ambulatory Visit: Payer: Self-pay

## 2021-04-06 ENCOUNTER — Emergency Department: Payer: 59

## 2021-04-06 DIAGNOSIS — J101 Influenza due to other identified influenza virus with other respiratory manifestations: Secondary | ICD-10-CM | POA: Diagnosis not present

## 2021-04-06 DIAGNOSIS — R059 Cough, unspecified: Secondary | ICD-10-CM | POA: Diagnosis present

## 2021-04-06 DIAGNOSIS — Z20822 Contact with and (suspected) exposure to covid-19: Secondary | ICD-10-CM | POA: Diagnosis not present

## 2021-04-06 LAB — RESP PANEL BY RT-PCR (FLU A&B, COVID) ARPGX2
Influenza A by PCR: POSITIVE — AB
Influenza B by PCR: NEGATIVE
SARS Coronavirus 2 by RT PCR: NEGATIVE

## 2021-04-06 NOTE — ED Triage Notes (Signed)
Pt presents today with complaints of a cough and generalized body aches - pt denies being exposed to COVID but believes this may be what she's experiencing.

## 2021-04-07 ENCOUNTER — Emergency Department
Admission: EM | Admit: 2021-04-07 | Discharge: 2021-04-07 | Disposition: A | Discharge status: Home / Self Care | Payer: 59 | Attending: Emergency Medicine | Admitting: Emergency Medicine

## 2021-04-07 DIAGNOSIS — J111 Influenza due to unidentified influenza virus with other respiratory manifestations: Secondary | ICD-10-CM

## 2021-04-07 MED ORDER — IBUPROFEN 400 MG PO TABS
400.0000 mg | ORAL_TABLET | Freq: Once | ORAL | Status: AC
  Administered 2021-04-07: 400 mg via ORAL
  Filled 2021-04-07: qty 1

## 2021-04-07 NOTE — ED Provider Notes (Signed)
[facility] Emergency Department Provider Note   ____________________________________________   Event Date/Time   First MD Initiated Contact with Patient 04/07/21 0011     (approximate)  I have reviewed the triage vital signs and the nursing notes.   HISTORY  Chief Complaint Cough and Generalized Body Aches    HPI Halia L Pilat is a 25 y.o. female with no significant past medical history who presents to the ED complaining of cough and body aches.  Patient reports that she has had 24 to 48 hours of frequent dry cough, soreness in her chest and lower back.  She has felt hot, but denies a fever when taking her temperature at home.  She complains of diffuse body aches that primarily affects her chest and her lower back, states her children have been sick with viral symptoms.  She denies any shortness of breath, nausea, vomiting, abdominal pain, or dysuria.        No past medical history on file.  Patient Active Problem List   Diagnosis Date Noted   Nausea and vomiting 01/23/2018   Eczema 07/17/2014   Allergic rhinitis due to allergen 07/17/2014    No past surgical history on file.  Prior to Admission medications   Medication Sig Start Date End Date Taking? Authorizing Provider  levonorgestrel (MIRENA) 20 MCG/24HR IUD 1 each by Intrauterine route once.    [provider]  loratadine (CLARITIN) 10 MG tablet Take 1 tablet (10 mg total) by mouth daily for 30 days. 07/26/18 08/25/18  Darci Current, MD  metroNIDAZOLE (FLAGYL) 500 MG tablet Take 1 tablet (500 mg total) by mouth 2 (two) times daily. 07/23/20   Federico Flake, MD  fluticasone [facility]) 50 MCG/ACT nasal spray Place 1 spray into both nostrils 2 (two) times daily. 06/25/17 06/02/19  Cuthriell, Delorise Royals, PA-C    Allergies Sulfamethoxazole-trimethoprim and Penicillins  Family History  Problem Relation Age of Onset   Diabetes Mother    Hypertension Mother    Hypertension  Father    Diabetes Maternal Grandfather    Prostate cancer Maternal Grandfather     Social History Social History   Tobacco Use   Smoking status: Never   Smokeless tobacco: Never  Vaping Use   Vaping Use: Some days  Substance Use Topics   Alcohol use: No   Drug use: Yes    Types: Marijuana    Review of Systems  Constitutional: No fever/chills Eyes: No visual changes. ENT: No sore throat. Cardiovascular: Positive for chest pain. Respiratory: Denies shortness of breath.  Positive for cough. Gastrointestinal: No abdominal pain.  No nausea, no vomiting.  No diarrhea.  No constipation. Genitourinary: Negative for dysuria. Musculoskeletal: Positive for back pain and body aches. Skin: Negative for rash. Neurological: Negative for headaches, focal weakness or numbness.  ____________________________________________   PHYSICAL EXAM:  VITAL SIGNS: ED Triage Vitals  Enc Vitals Group     BP 04/06/21 1936 122/78     Pulse Rate 04/06/21 1936 (!) 107     Resp 04/06/21 1936 20     Temp 04/06/21 1936 100.2 F (37.9 C)     Temp Source 04/06/21 1936 Oral     SpO2 04/06/21 1936 96 %     Weight 04/06/21 1933 165 lb (74.8 kg)     Height 04/06/21 1933 5\' 1"  (1.549 m)     Head Circumference --      Peak Flow --      Pain Score 04/06/21 1937 8  Pain Loc --      Pain Edu? --      Excl. in GC? --     Constitutional: Alert and oriented. Eyes: Conjunctivae are normal. Head: Atraumatic. Nose: No congestion/rhinnorhea. Mouth/Throat: Mucous membranes are moist. Neck: Normal ROM Cardiovascular: Normal rate, regular rhythm. Grossly normal heart sounds.  2+ radial pulses bilaterally. Respiratory: Normal respiratory effort.  No retractions. Lungs CTAB. Gastrointestinal: Soft and nontender. No distention. Genitourinary: deferred Musculoskeletal: No lower extremity tenderness nor edema. Neurologic:  Normal speech and language. No gross focal neurologic deficits are appreciated. Skin:   Skin is warm, dry and intact. No rash noted. Psychiatric: Mood and affect are normal. Speech and behavior are normal.  ____________________________________________   LABS (all labs ordered are listed, but only abnormal results are displayed)  Labs Reviewed  RESP PANEL BY RT-PCR (FLU A&B, COVID) ARPGX2 - Abnormal; Notable for the following components:      Result Value   Influenza A by PCR POSITIVE (*)    All other components within normal limits   ____________________________________________  EKG  ED ECG REPORT I, Chesley Noon, the attending physician, personally viewed and interpreted this ECG.   Date: 04/07/2021  EKG Time: 19:46  Rate: 113  Rhythm: sinus tachycardia  Axis: Normal  Intervals:none  ST&T Change: None   PROCEDURES  Procedure(s) performed (including Critical Care):  Procedures   ____________________________________________   INITIAL IMPRESSION / ASSESSMENT AND PLAN / ED COURSE      25 year old female with no significant past medical history presents to the ED complaining of cough, chest pain, and back pain for the past 24 to 48 hours.  Patient is afebrile in the ED, noted to be mildly tachycardic but vital signs otherwise reassuring.  EKG shows sinus tachycardia with no ischemic changes and I doubt ACS or PE.  Chest x-ray reviewed by me and shows no infiltrate, edema, or effusion.  Viral testing is positive for influenza A, which would explain patient's symptoms.  She is appropriate for discharge home with PCP follow-up as needed and symptomatic management, was counseled to return to the ED for new worsening symptoms.  Patient agrees with plan.      ____________________________________________   FINAL CLINICAL IMPRESSION(S) / ED DIAGNOSES  Final diagnoses:  Influenza     ED Discharge Orders     None        Note:  This document was prepared using Dragon voice recognition software and may include unintentional dictation errors.     Chesley Noon, MD 04/07/21 0030

## 2021-07-21 ENCOUNTER — Encounter: Payer: Self-pay | Admitting: Advanced Practice Midwife

## 2021-07-21 ENCOUNTER — Other Ambulatory Visit: Payer: Self-pay

## 2021-07-21 ENCOUNTER — Ambulatory Visit (LOCAL_COMMUNITY_HEALTH_CENTER): Payer: 59 | Admitting: Advanced Practice Midwife

## 2021-07-21 VITALS — BP 107/66 | HR 87 | Temp 98.4°F | Ht 62.0 in | Wt 166.6 lb

## 2021-07-21 DIAGNOSIS — Z720 Tobacco use: Secondary | ICD-10-CM | POA: Diagnosis not present

## 2021-07-21 DIAGNOSIS — Z3049 Encounter for surveillance of other contraceptives: Secondary | ICD-10-CM

## 2021-07-21 DIAGNOSIS — E669 Obesity, unspecified: Secondary | ICD-10-CM

## 2021-07-21 DIAGNOSIS — F17200 Nicotine dependence, unspecified, uncomplicated: Secondary | ICD-10-CM

## 2021-07-21 DIAGNOSIS — Z831 Family history of other infectious and parasitic diseases: Secondary | ICD-10-CM

## 2021-07-21 DIAGNOSIS — Z3009 Encounter for other general counseling and advice on contraception: Secondary | ICD-10-CM

## 2021-07-21 LAB — HM HIV SCREENING LAB: HM HIV Screening: NEGATIVE

## 2021-07-21 LAB — WET PREP FOR TRICH, YEAST, CLUE
Trichomonas Exam: NEGATIVE
Yeast Exam: NEGATIVE

## 2021-07-21 LAB — HM HEPATITIS C SCREENING LAB: HM Hepatitis Screen: NEGATIVE

## 2021-07-21 NOTE — Progress Notes (Signed)
Wet prep reviewed - negative results. Kamal Jurgens, RN  

## 2021-07-21 NOTE — Progress Notes (Signed)
[facility] [address] Main Number: [phone]    Family Planning Visit- Initial Visit  Subjective:  Annette White is a 26 y.o.SWF vaper  [id number] (12, 9,7,6)  being seen today for an initial annual visit and to discuss reproductive life planning.  The patient is currently using IUD or IUS for pregnancy prevention. Patient reports   does not want a pregnancy in the next year.  Patient has the following medical conditions has Eczema; Allergic rhinitis due to allergen; Obesity BMI 30.4;  history of STDs (trich, Chlamydia, GC, PID); Smoker; and Vapes nicotine containing substance on their problem list.  Chief Complaint  Patient presents with   Annual Exam    Patient reports here for physical and pap. LMP 06/14/21. Last sex 07/12/21 without condom; with current partner x 8 years; 2 sex partners in last 3 mo. Vaper. Last cigar 07/12/21. Last ETOH 07/18/21 (5 shots liquor) 1x/mo. Last MJ this am. Pt states Lilleta inserted by [facility] 04/2015 and happy with it. Last pap >5 years ago (maybe 2016?). Last dental exam 2022. Living with her 4 kids and her mom. Working 50 hrs/wk. Her boyfriend smokes meth, MJ, snorts cocaine. Hx PID 03/21/16, trich 2022, GC several years ago, Chlamydia several years ago. Can't remember last PE  Patient denies cigs.  Body mass index is 30.47 kg/m. - Patient is eligible for diabetes screening based on BMI and age >74?  not applicable HA1C ordered? not applicable  Patient reports 3  partner/s in last year. Desires STI screening?  Yes  Has patient been screened once for HCV in the past?  Yes  No results found for: HCVAB  Does the patient have current drug use (including MJ), have a partner with drug use, and/or has been incarcerated since last result? Yes  If yes-- Screen for HCV through [facility]   Does the patient meet criteria for HBV testing? Yes  Criteria:  -Household, sexual or needle sharing  contact with HBV -History of drug use -HIV positive -Those with known Hep C   Health Maintenance Due  Topic Date Due   COVID-19 Vaccine (1) Never done   TETANUS/TDAP  Never done   PAP-Cervical Cytology Screening  Never done   PAP SMEAR-Modifier  Never done   INFLUENZA VACCINE  Never done    Review of Systems  Constitutional:  Positive for weight loss (loss and gain of 10 lbs over 3 mo; not exercising).  Neurological:  Positive for headaches (2x/wk always occiput and right side; relieved with sleep, -N&V, -vision,-audio).   The following portions of the patient's history were reviewed and updated as appropriate: allergies, current medications, past family history, past medical history, past social history, past surgical history and problem list. Problem list updated.   See flowsheet for other program required questions.  Objective:   Vitals:   07/21/21 1604  BP: 107/66  Pulse: 87  Temp: 98.4 F (36.9 C)  Weight: 166 lb 9.6 oz (75.6 kg)  Height: 5\' 2"  (1.575 m)    Physical Exam Constitutional:      Appearance: Normal appearance. She is normal weight.  HENT:     Head: Normocephalic and atraumatic.     Mouth/Throat:     Mouth: Mucous membranes are moist.  Eyes:     Conjunctiva/sclera: Conjunctivae normal.  Neck:     Thyroid: No thyroid mass, thyromegaly or thyroid tenderness.  Cardiovascular:     Rate and Rhythm: Normal rate and  regular rhythm.  Pulmonary:     Effort: Pulmonary effort is normal.     Breath sounds: Normal breath sounds.  Chest:  Breasts:    Right: Normal.     Left: Normal.  Abdominal:     Palpations: Abdomen is soft.     Comments: Soft without masses or tenderness, poor tone, increased adipose  Genitourinary:    General: Normal vulva.     Exam position: Lithotomy position.     Vagina: Vaginal discharge (white creamy leukorrhea, ph>4.5) present.     Cervix: Normal.     Uterus: Normal.      Adnexa: Right adnexa normal and left adnexa normal.      Rectum: Normal.     Comments: Liletta string visualized Pap done Musculoskeletal:        General: Normal range of motion.     Cervical back: Normal range of motion and neck supple.  Skin:    General: Skin is warm and dry.  Neurological:     Mental Status: She is alert.  Psychiatric:        Mood and Affect: Mood normal.      Assessment and Plan:  Annette White is a 26 y.o. female presenting to the [facility] Department for an initial annual wellness/contraceptive visit  Contraception counseling: Reviewed all forms of birth control options in the tiered based approach. available including abstinence; over the counter/barrier methods; hormonal contraceptive medication including pill, patch, ring, injection,contraceptive implant, ECP; hormonal and nonhormonal IUDs; permanent sterilization options including vasectomy and the various tubal sterilization modalities. Risks, benefits, and typical effectiveness rates were reviewed.  Questions were answered.  Written information was also given to the patient to review.  Patient desires to keep IUD or IUS, this was prescribed for patient.    The patient will follow up in  1 year for surveillance.  The patient was told to call with any further questions, or with any concerns about this method of contraception.  Emphasized use of condoms 100% of the time for STI prevention.  Patient was not offered ECP based on not meeting criteria. ECP was not accepted by the patient. ECP counseling was not given - see RN documentation  1. Family planning Please give primary care MD list to pt Please give contact info for Kathreen Cosier, LCSW Treat wet mount per standing orders Immunization nurse consult - Syphilis Serology, [place] [facility] - HIV/HCV [place] [facility] - WET PREP FOR TRICH, YEAST, CLUE - IGP, rfx Aptima HPV ASCU - Chlamydia/Gonorrhea [place] [facility]  2. Encounter for surveillance of other contraceptive Pt states Liletta inserted by  [facility] 04/2015  3. Obesity, unspecified classification, unspecified obesity type, unspecified whether serious comorbidity present   4.  history of STDs (trich, Chlamydia, GC, PID)   5. Smoker Counseled to stop via 5 A's   6. Vapes nicotine containing substance Counseled to stop     Return in about 1 year (around 07/21/2022) for yearly physical exam.  No future appointments.  Alberteen Spindle, CNM

## 2021-07-23 LAB — IGP, RFX APTIMA HPV ASCU: PAP Smear Comment: 0

## 2021-08-13 ENCOUNTER — Ambulatory Visit: Payer: 59 | Admitting: Licensed Clinical Social Worker

## 2021-08-13 DIAGNOSIS — F4323 Adjustment disorder with mixed anxiety and depressed mood: Secondary | ICD-10-CM

## 2021-08-13 NOTE — Progress Notes (Signed)
Counselor Initial Adult Exam  Name: Annette White Date: 08/13/2021 MRN: [id number] DOB: 1996/03/17 PCP: Patient, No Pcp Per (Inactive)  Time spent: 60 minutes   A biopsychosocial was completed on the Patient. Background information and current concerns were obtained during an intake in the office  with the [facility] Department clinician, Milton Ferguson, LCSW.  Contact information and confidentiality was discussed and appropriate consents were signed.      Reason for Visit /Presenting Problem: Patient reports that she came to therapy because her family 34 that she is "crazy". She reports that she doesn't think she needs it but thought she would give it a try. Patient reports that he family thinks she 23 crazy things but has a difficult time describing why her family thinks she needs therapy. Patient reports that she has been sad and worries a lot and states that she doesn't like to call it depression. She reports that she has had these symptoms for sometime, but they have always been manageable until October when she and the father of her two youngest children broke up. She states that at this time her symptoms worsened. Patient reports that they were together for 8 years. She becomes visibly upset and crys when asked any questions about this situation. She does report that he has a drug problem and is incarcerated as of today. Patient endorsees depressive symptoms and anxiety symptoms. She reports sleeping a lot when she has the opportunity, she can sleep for more than 12 hours straight. She also reports worrying a lot and feeling like her worries cause her to be sad. Patient reports daily use of mariajuana to help with her symptoms and makes her more relaxed. She reports that she does not buy it so can go without it when she doesn't have it and has plans to stop using because she wants to prove to the father of her children that she can stop using.  Patient reports she and her four  children and her mother live together. She reports having a good support system, including family and friends.   Patient describes living from place to place as a child. She reports that at age 5yo she began living with various family members because her mom had a substance addiction problem. She reports that her mother has been sober for 12 years, since patient had her first child when she was 45 yo. Patient reports that she has a close relationship with her dad but reports that he has an alcohol addiction. Patient denies ever experiencing any type of abuse.   Depression screen PHQ 2/9 07/21/2021  Decreased Interest 3  Down, Depressed, Hopeless 2  PHQ - 2 Score 5  Altered sleeping 2  Tired, decreased energy 2  Change in appetite 3  Feeling bad or failure about yourself  1  Trouble concentrating 3  Moving slowly or fidgety/restless 0  Suicidal thoughts 0  PHQ-9 Score 16  Difficult doing work/chores Somewhat difficult   GAD 7 : Generalized Anxiety Score 08/13/2021  Nervous, Anxious, on Edge 2  Control/stop worrying 3  Worry too much - different things 3  Trouble relaxing 1  Restless 0  Easily annoyed or irritable 1  Afraid - awful might happen 3  Total GAD 7 Score 13  Anxiety Difficulty Somewhat difficult   Mental Status Exam:    Appearance:   Casual     Behavior:  Appropriate, Sharing, Evasive, and Minimizing  Motor:  Normal  Speech/Language:   Clear and Coherent and  Normal Rate  Affect:  Appropriate, Congruent, Full Range, and Tearful  Mood:  labile  Thought process:  normal  Thought content:    WNL  Sensory/Perceptual disturbances:    WNL  Orientation:  oriented to person, place, time/date, and situation  Attention:  Good  Concentration:  Good  Memory:  WNL  Fund of knowledge:   Fair  Insight:    Fair  Judgment:   Fair  Impulse Control:  Fair   Reported Symptoms:   Sadness, low energy, sleep disturbance- increased need for sleep, anxiety, worried   Risk  Assessment: Danger to Self:  No Self-injurious Behavior: No Danger to Others: No Duty to Warn:no Physical Aggression / Violence:No  Access to Firearms a concern: No  Gang Involvement:No  Patient / guardian was educated about steps to take if suicide or homicide risk level increases between visits: Yes While future psychiatric events cannot be accurately predicted, the patient does not currently require acute inpatient psychiatric care and does not currently meet [facility] involuntary commitment criteria.  Substance Abuse History: Current substance abuse: Yes  Mariajuana use. Patient reports smoking one blunt per day- throughout the day. She reports that she has cut back and sometimes does not smoke at all and doesn't ever pay for it. She also reports a desire to quit using mariajuana.   Past Psychiatric History:   No previous psychological problems have been observed Patient's mom has Depression and Bipolar Disorder, Cousin with ADHD and Depression and a cousin with postpartum psychosis  Outpatient Providers: NA History of Psych Hospitalization: No   Abuse History:  Patient denies any history of abuse.  Victim of No.,  Report needed: No. Victim of Neglect:No. Perpetrator of  NA   Witness / Exposure to Domestic Violence: No   Protective Services Involvement: No  Witness to [employer] Violence:  No   Family History:  Family History  Problem Relation Age of Onset   Diabetes Mother    Hypertension Mother    Hypertension Father    Diabetes Maternal Grandfather    Prostate cancer Maternal Grandfather    Social History:  Social History   Socioeconomic History   Marital status: Significant Other    Spouse name: Not on file   Number of children: 4   Years of education: 11   Highest education level: 11th grade  Occupational History   Not on file  Tobacco Use   Smoking status: Every Day    Types: E-cigarettes, Cigars    Passive exposure: Current   Smokeless tobacco:  Never  Vaping Use   Vaping Use: Every day   Substances: Flavoring  Substance and Sexual Activity   Alcohol use: Yes    Alcohol/week: 5.0 standard drinks    Types: 5 Shots of liquor per week    Comment: last use 07/18/21 1x/mo   Drug use: Yes    Types: Marijuana    Comment: Last marijuana use 07/21/2021 am.   Sexual activity: Yes    Partners: Male    Birth control/protection: I.U.D.  Other Topics Concern   Not on file  Social History Narrative   Not on file   Social Determinants of Health   Financial Resource Strain: Not on file  Food Insecurity: Not on file  Transportation Needs: Not on file  Physical Activity: Not on file  Stress: Not on file  Social Connections: Not on file   Living situation: the patient and her 4 children and her mom live together and have been for  5 years   Sexual Orientation:  Straight  Relationship Status: single patient and the father of her youngest two children broke up in October after an 8 year relationship  Name of spouse / other: NA             If a parent, number of children / ages: 64 [place]; friends, parents, Family Patient reports that she has a good support system.   Financial Stress:  No   Income/Employment/Disability: Employment work full-time sometimes more then 50 hours a week. Patient has worked at the same place for 8 years.   Military Service: No   Educational History: Education: 11th grade  Religion/Sprituality/World View:    Christian   Any cultural differences that may affect / interfere with treatment:  not applicable   Recreation/Hobbies: None, Patient reports that she doesn't know what she likes, she does what the kids want to do.    Stressors:Other: Patient's biggest stressor is the break up with the father of her youngest two children and him being addicted to drugs     Strengths:  Supportive Relationships, Family, Friends, and Church  Barriers:  Patient's work schedule may create a barrier  to being able to attend sessions.   Legal History: Pending legal issue / charges: The patient has no significant history of legal issues. History of legal issue / charges:  None  Medical History/Surgical History:reviewed Past Medical History:  Diagnosis Date   No pertinent past medical history     Past Surgical History:  Procedure Laterality Date   Denies surgical hx     Medications: Current Outpatient Medications  Medication Sig Dispense Refill   levonorgestrel (LILETTA, 52 MG,) 20.1 MCG/DAY IUD 1 each by Intrauterine route once. States inserted by [facility] 04/2015.     levonorgestrel (MIRENA) 20 MCG/24HR IUD 1 each by Intrauterine route once. (Patient not taking: Reported on 07/21/2021)     loratadine (CLARITIN) 10 MG tablet Take 1 tablet (10 mg total) by mouth daily for 30 days. 30 tablet 0   metroNIDAZOLE (FLAGYL) 500 MG tablet Take 1 tablet (500 mg total) by mouth 2 (two) times daily. (Patient not taking: Reported on 07/21/2021) 14 tablet 0   No current facility-administered medications for this visit.    Allergies  Allergen Reactions   Sulfamethoxazole-Trimethoprim Hives   Penicillins Rash    Has patient had a PCN reaction causing immediate rash, facial/tongue/throat swelling, SOB or lightheadedness with hypotension: Yes Has patient had a PCN reaction causing severe rash involving mucus membranes or skin necrosis: No Has patient had a PCN reaction that required hospitalization No Has patient had a PCN reaction occurring within the last 10 years: Yes If all of the above answers are "NO", then may proceed with Cephalosporin use.   SUMAR VIRTS is a 26 y.o. year old female with no reported history of mental health diagnoses. Patient currently presents with depressed mood and anxiety that she reports has worsened since October 2022 due to multiple stressors. Patient currently describes adjustment disorder with both depressive symptoms and anxiety symptoms. She reports  significant anxiety symptoms, including feeling anxious, difficulties controlling worries, worrying about a variety of things, difficulties relaxing, and irritability. She also describes low mood, sadness, low energy and hypersomnia. Patient denies any suicidal ideation or homicidal ideation. Patient also denies any hallucinations, or psychotic thought patterns. In addition, patient reports daily cannabis use. Patient reports that these symptoms impact her functioning in multiple life domains.   Due to the  above symptoms and patient's reported history, patient is diagnosed with Adjustment Disorder, with mixed anxiety and depressed mood. Patient's mood symptoms should continue to be monitored closely to provide further diagnosis clarification. Continued mental health treatment is needed to address patient's symptoms and monitor her safety and stability. Patient is recommended for continued outpatient therapy to reduce her symptoms and improve her coping strategies.    There is no acute risk for suicide or violence at this time.  While future psychiatric events cannot be accurately predicted, the patient does not require acute inpatient psychiatric care and does not currently meet [facility] involuntary commitment criteria.  Diagnoses:    ICD-10-CM   1. Adjustment disorder with mixed anxiety and depressed mood  F43.23       Plan of Care:  Patient's goal of treatment is to think more positive, to do the right thing.   -LCSW provided psychoeducation on CBTs.  -LCSW and patient agreed to develop a treatment plan at next session.    -LCSW encouraged patient to be seen by PCP due to low energy and hypersomnia.   Future Appointments  Date Time Provider [place]  08/27/2021  4:00 PM Milton Ferguson, LCSW [facility] None    Milton Ferguson, [place]

## 2021-08-27 ENCOUNTER — Ambulatory Visit: Payer: 59 | Admitting: Licensed Clinical Social Worker

## 2021-08-27 DIAGNOSIS — F4323 Adjustment disorder with mixed anxiety and depressed mood: Secondary | ICD-10-CM

## 2021-08-27 NOTE — Progress Notes (Signed)
Counselor/Therapist Progress Note ? ?Patient ID: ZOA DOWTY, MRN: [id number],   ? ?Date: 08/27/2021 ? ?Time Spent: 45 minutes   ? ?Treatment Type: Psychotherapy ? ?Reported Symptoms:  low mood, sadness, anxiety, irritability  ? ?Mental Status Exam: ? ?Appearance:   Casual and Neat     ?Behavior:  Appropriate and Sharing  ?Motor:  Normal  ?Speech/Language:   Clear and Coherent and Normal Rate  ?Affect:  Appropriate, Congruent, Full Range, and Tearful  ?Mood:  normal  ?Thought process:  normal  ?Thought content:    WNL  ?Sensory/Perceptual disturbances:    WNL  ?Orientation:  oriented to person, place, time/date, and situation  ?Attention:  Good  ?Concentration:  Good  ?Memory:  WNL  ?Fund of knowledge:   Fair  ?Insight:    Fair  ?Judgment:   Good  ?Impulse Control:  Good  ? ?Risk Assessment: ?Danger to Self:  No ?Self-injurious Behavior: No ?Danger to Others: No ?Duty to Warn:no ?Physical Aggression / Violence:No  ?Access to Firearms a concern: No  ?Gang Involvement:No  ? ?Subjective: Patient was engaged and cooperative throughout the session using time effectively to discuss thoughts and feelings. Patient voices continued motivation for treatment and understanding of depressive symptoms and anxiety and how CBTS may be of benefit. Patient is likely to benefit from future treatment because she is motivated to decrease symptoms and improve functioning.   ? ?Interventions: Cognitive Behavioral Therapy ? Checked in with patient and reviewed previous session, including assessment and goal of treatment. Reviewed CBTs. Explored patient's goal of treatment and worked collaboratively to develop CBT treatment plan. Provided psychoeducation on Unhelpful Thought Patterns. Provided support through active listening, validation of feelings, and highlighted patient's strengths.  ? ?Diagnosis: ?  ICD-10-CM   ?1. Adjustment disorder with mixed anxiety and depressed mood  F43.23   ?  ? ?Plan: Patient's goal of treatment is to  think more positive, to do the right thing.  ? ?Treatment Target: Understand the relationship between thoughts, emotions, and behaviors  ?Psychoeducation on CBT model   ?Teach the connection between thoughts, emotions, and behaviors  ?Enhance emotional awareness and discrimination of emotions  ?Treatment Target: Increase realistic balanced thinking -to learn how to replace thinking with thoughts that are more accurate or helpful ?Explore patient's thoughts, beliefs, automatic thoughts, assumptions  ?Identify and replace unhelpful thinking patterns (upsetting ideas, self-talk and mental images) ?Process distress and allow for emotional release  ?Questioning and challenging thoughts ?Cognitive reappraisal  ?Restructuring, Socratic questioning  ?Treatment Target: Increase coping skills ?Teach distress tolerance techniques - ?what helps me?  ?Mindfulness practices  ?STOP technique ?Values driven behaviors  ?Self-care - nutrition, sleep, exercise ? ?Future Appointments  ?Date Time Provider Department Center  ?09/17/2021  4:00 PM Kathreen Cosier, LCSW [facility] None  ? ? ?Kathreen Cosier, LCSW ? ?

## 2021-09-17 ENCOUNTER — Ambulatory Visit: Payer: 59 | Admitting: Licensed Clinical Social Worker

## 2021-09-17 NOTE — Progress Notes (Unsigned)
Counselor/Therapist Progress Note ? ?Patient ID: Annette White, MRN: [id number],   ? ?Date: 09/17/2021 ? ?Time Spent: ***  ? ?Treatment Type: Psychotherapy ? ?Reported Symptoms: {CHL AMB Reported [phone]} ? ?Mental Status Exam: ? ?Appearance:   {PSY:22683}     ?Behavior:  {PSY:21022743}  ?Motor:  {PSY:22302}  ?Speech/Language:   {PSY:22685}  ?Affect:  {PSY:22687}  ?Mood:  {PSY:31886}  ?Thought process:  {PSY:31888}  ?Thought content:    {PSY:[id number]}  ?Sensory/Perceptual disturbances:    {PSY:[id number]}  ?Orientation:  {PSY:30297}  ?Attention:  {PSY:22877}  ?Concentration:  {PSY:938-616-6621}  ?Memory:  {PSY:780-320-0584}  ?Fund of knowledge:   {PSY:938-616-6621}  ?Insight:    {PSY:938-616-6621}  ?Judgment:   {PSY:938-616-6621}  ?Impulse Control:  {PSY:938-616-6621}  ? ?Risk Assessment: ?Danger to Self:  No ?Self-injurious Behavior: No ?Danger to Others: No ?Duty to Warn:no ?Physical Aggression / Violence:No  ?Access to Firearms a concern: No  ?Gang Involvement:No  ? ?Subjective: Patient was receptive to feedback and intervention from LCSW and actively and effectively participated throughout the session. Patient is likely to benefit from future treatment because  remains motivated to decrease  And   and reports benefit of regular sessions in addressing these symptoms.     ? ?Interventions: {PSY:[id number]} ?Checked in with patient regarding their week. LCSW assisted patient in processing their emotions about what they have experienced in ... and ... with / at .... LCSW reviewed behavior modification, distress tolerance and effective communication skills with patient. Provided support through active listening, validation of feelings, and highlighted patient's strengths.   ? ? ?Diagnosis: ?  ICD-10-CM   ?1. Adjustment disorder with mixed anxiety and depressed mood  F43.23   ?  ? ?Plan: Patient's goal of treatment is to think more positive, to do the right thing.  ?  ?Treatment Target: Understand the  relationship between thoughts, emotions, and behaviors  ?Psychoeducation on CBT model   ?Teach the connection between thoughts, emotions, and behaviors  ?Enhance emotional awareness and discrimination of emotions  ?Treatment Target: Increase realistic balanced thinking -to learn how to replace thinking with thoughts that are more accurate or helpful ?Explore patient's thoughts, beliefs, automatic thoughts, assumptions  ?Identify and replace unhelpful thinking patterns (upsetting ideas, self-talk and mental images) ?Process distress and allow for emotional release  ?Questioning and challenging thoughts ?Cognitive reappraisal  ?Restructuring, Socratic questioning  ?Treatment Target: Increase coping skills ?Teach distress tolerance techniques - ?what helps me?  ?Mindfulness practices  ?STOP technique ?Values driven behaviors  ?Self-care - nutrition, sleep, exercise ? ?Future Appointments  ?Date Time Provider Department Center  ?09/17/2021  4:00 PM Kathreen Cosier, LCSW [facility] None  ? ?Kathreen Cosier, LCSW ? ?

## 2021-12-19 ENCOUNTER — Observation Stay
Admission: EM | Admit: 2021-12-19 | Discharge: 2021-12-21 | Disposition: A | Discharge status: Home / Self Care | Payer: 59 | Attending: Obstetrics and Gynecology | Admitting: Obstetrics and Gynecology

## 2021-12-19 ENCOUNTER — Emergency Department: Payer: 59

## 2021-12-19 ENCOUNTER — Other Ambulatory Visit: Payer: Self-pay

## 2021-12-19 ENCOUNTER — Encounter: Payer: Self-pay | Admitting: Intensive Care

## 2021-12-19 DIAGNOSIS — Z87891 Personal history of nicotine dependence: Secondary | ICD-10-CM

## 2021-12-19 DIAGNOSIS — A5602 Chlamydial vulvovaginitis: Secondary | ICD-10-CM | POA: Diagnosis not present

## 2021-12-19 DIAGNOSIS — N739 Female pelvic inflammatory disease, unspecified: Principal | ICD-10-CM | POA: Insufficient documentation

## 2021-12-19 DIAGNOSIS — R109 Unspecified abdominal pain: Secondary | ICD-10-CM | POA: Diagnosis present

## 2021-12-19 DIAGNOSIS — T8332XA Displacement of intrauterine contraceptive device, initial encounter: Secondary | ICD-10-CM | POA: Diagnosis present

## 2021-12-19 DIAGNOSIS — R1032 Left lower quadrant pain: Secondary | ICD-10-CM

## 2021-12-19 DIAGNOSIS — N7001 Acute salpingitis: Secondary | ICD-10-CM | POA: Diagnosis not present

## 2021-12-19 DIAGNOSIS — Z880 Allergy status to penicillin: Secondary | ICD-10-CM

## 2021-12-19 DIAGNOSIS — T8339XA Other mechanical complication of intrauterine contraceptive device, initial encounter: Secondary | ICD-10-CM | POA: Diagnosis not present

## 2021-12-19 DIAGNOSIS — A5611 Chlamydial female pelvic inflammatory disease: Secondary | ICD-10-CM | POA: Diagnosis present

## 2021-12-19 DIAGNOSIS — N7093 Salpingitis and oophoritis, unspecified: Principal | ICD-10-CM

## 2021-12-19 LAB — URINALYSIS, ROUTINE W REFLEX MICROSCOPIC
Bacteria, UA: NONE SEEN
Bilirubin Urine: NEGATIVE
Glucose, UA: NEGATIVE mg/dL
Ketones, ur: NEGATIVE mg/dL
Leukocytes,Ua: NEGATIVE
Nitrite: NEGATIVE
Protein, ur: NEGATIVE mg/dL
Specific Gravity, Urine: 1.011 (ref 1.005–1.030)
pH: 7 (ref 5.0–8.0)

## 2021-12-19 LAB — LIPASE, BLOOD: Lipase: 27 U/L (ref 11–51)

## 2021-12-19 LAB — CBC
HCT: 37.4 % (ref 36.0–46.0)
Hemoglobin: 12 g/dL (ref 12.0–15.0)
MCH: 28.8 pg (ref 26.0–34.0)
MCHC: 32.1 g/dL (ref 30.0–36.0)
MCV: 89.7 fL (ref 80.0–100.0)
Platelets: 327 10*3/uL (ref 150–400)
RBC: 4.17 MIL/uL (ref 3.87–5.11)
RDW: 11.9 % (ref 11.5–15.5)
WBC: 16.7 10*3/uL — ABNORMAL HIGH (ref 4.0–10.5)
nRBC: 0 % (ref 0.0–0.2)

## 2021-12-19 LAB — COMPREHENSIVE METABOLIC PANEL
ALT: 18 U/L (ref 0–44)
AST: 26 U/L (ref 15–41)
Albumin: 4 g/dL (ref 3.5–5.0)
Alkaline Phosphatase: 52 U/L (ref 38–126)
Anion gap: 9 (ref 5–15)
BUN: 7 mg/dL (ref 6–20)
CO2: 24 mmol/L (ref 22–32)
Calcium: 9.1 mg/dL (ref 8.9–10.3)
Chloride: 104 mmol/L (ref 98–111)
Creatinine, Ser: 0.59 mg/dL (ref 0.44–1.00)
GFR, Estimated: >60 mL/min (ref >60)
Glucose, Bld: 99 mg/dL (ref 70–99)
Potassium: 3.7 mmol/L (ref 3.5–5.1)
Sodium: 137 mmol/L (ref 135–145)
Total Bilirubin: 0.4 mg/dL (ref 0.3–1.2)
Total Protein: 8.1 g/dL (ref 6.5–8.1)

## 2021-12-19 LAB — POC URINE PREG, ED: Preg Test, Ur: NEGATIVE

## 2021-12-19 LAB — CHLAMYDIA/NGC RT PCR (ARMC ONLY)
Chlamydia Tr: DETECTED — AB
N gonorrhoeae: NOT DETECTED

## 2021-12-19 MED ORDER — SODIUM CHLORIDE 0.9 % IV SOLN
2.0000 g | Freq: Four times a day (QID) | INTRAVENOUS | Status: DC
  Administered 2021-12-19: 2 g via INTRAVENOUS
  Filled 2021-12-19 (×2): qty 2

## 2021-12-19 MED ORDER — ONDANSETRON HCL 4 MG/2ML IJ SOLN
4.0000 mg | Freq: Four times a day (QID) | INTRAMUSCULAR | Status: DC | PRN
  Administered 2021-12-19 – 2021-12-20 (×2): 4 mg via INTRAVENOUS
  Filled 2021-12-19 (×2): qty 2

## 2021-12-19 MED ORDER — SENNOSIDES-DOCUSATE SODIUM 8.6-50 MG PO TABS: 1.0000 | ORAL_TABLET | Freq: Every evening | ORAL | Status: DC | PRN

## 2021-12-19 MED ORDER — KETOROLAC TROMETHAMINE 30 MG/ML IJ SOLN
30.0000 mg | Freq: Three times a day (TID) | INTRAMUSCULAR | Status: DC | PRN
Start: 2021-12-20 — End: 2021-12-21
  Filled 2021-12-19: qty 1

## 2021-12-19 MED ORDER — SODIUM CHLORIDE 0.9 % IV BOLUS
1000.0000 mL | Freq: Once | INTRAVENOUS | Status: AC
  Administered 2021-12-19: 1000 mL via INTRAVENOUS

## 2021-12-19 MED ORDER — ACETAMINOPHEN 500 MG PO TABS
1000.0000 mg | ORAL_TABLET | Freq: Four times a day (QID) | ORAL | Status: DC
Start: 2021-12-19 — End: 2021-12-20
  Administered 2021-12-19 – 2021-12-20 (×6): 1000 mg via ORAL
  Filled 2021-12-19 (×6): qty 2

## 2021-12-19 MED ORDER — PRENATAL MULTIVITAMIN CH
1.0000 | ORAL_TABLET | Freq: Every day | ORAL | Status: DC
  Filled 2021-12-19: qty 1

## 2021-12-19 MED ORDER — FENTANYL CITRATE PF 50 MCG/ML IJ SOSY: 50.0000 ug | PREFILLED_SYRINGE | INTRAMUSCULAR | Status: DC | PRN

## 2021-12-19 MED ORDER — SODIUM CHLORIDE 0.9 % IV SOLN
2.0000 g | Freq: Four times a day (QID) | INTRAVENOUS | Status: DC
  Administered 2021-12-19 – 2021-12-21 (×7): 2 g via INTRAVENOUS
  Filled 2021-12-19 (×10): qty 2

## 2021-12-19 MED ORDER — KETOROLAC TROMETHAMINE 30 MG/ML IJ SOLN
30.0000 mg | Freq: Four times a day (QID) | INTRAMUSCULAR | Status: DC | PRN
  Administered 2021-12-20 – 2021-12-21 (×2): 30 mg via INTRAVENOUS
  Filled 2021-12-19: qty 1

## 2021-12-19 MED ORDER — OXYCODONE HCL 5 MG PO TABS
5.0000 mg | ORAL_TABLET | Freq: Four times a day (QID) | ORAL | Status: DC | PRN
  Administered 2021-12-19 – 2021-12-20 (×2): 5 mg via ORAL
  Filled 2021-12-19 (×2): qty 1

## 2021-12-19 MED ORDER — IOHEXOL 300 MG/ML  SOLN
100.0000 mL | Freq: Once | INTRAMUSCULAR | Status: AC | PRN
  Administered 2021-12-19: 100 mL via INTRAVENOUS

## 2021-12-19 MED ORDER — ONDANSETRON HCL 4 MG PO TABS: 4.0000 mg | ORAL_TABLET | Freq: Four times a day (QID) | ORAL | Status: DC | PRN

## 2021-12-19 MED ORDER — SODIUM CHLORIDE 0.9 % IV SOLN
100.0000 mg | Freq: Once | INTRAVENOUS | Status: AC
  Administered 2021-12-19: 100 mg via INTRAVENOUS
  Filled 2021-12-19 (×2): qty 100

## 2021-12-19 MED ORDER — LACTATED RINGERS IV SOLN: INTRAVENOUS | Status: DC

## 2021-12-19 MED ORDER — METRONIDAZOLE 500 MG/100ML IV SOLN
500.0000 mg | Freq: Once | INTRAVENOUS | Status: AC
  Administered 2021-12-19: 500 mg via INTRAVENOUS
  Filled 2021-12-19: qty 100

## 2021-12-19 MED ORDER — SODIUM CHLORIDE 0.9 % IV SOLN
100.0000 mg | Freq: Two times a day (BID) | INTRAVENOUS | Status: DC
  Administered 2021-12-19 – 2021-12-20 (×3): 100 mg via INTRAVENOUS
  Filled 2021-12-19 (×4): qty 100

## 2021-12-19 NOTE — ED Triage Notes (Addendum)
Patient c/o abdominal pain with N/V/D X2 weeks. Also reports lower back pain. Reports diarrhea is 4-5 times a day. Patient reports she started bleeding June 1st and bled until June 20th. Currently has IUD that was placed about 6 years ago. Reports the pain comes intermittently and when it does, it feels like a contraction

## 2021-12-19 NOTE — ED Notes (Signed)
OB at bedside.  Called pharmacy who will send IV doxy.

## 2021-12-19 NOTE — ED Provider Notes (Signed)
[facility] Provider Note   Event Date/Time   First MD Initiated Contact with Patient 12/19/21 [phone]     (approximate) History  Abdominal Pain  HPI Annette White is a 26 y.o. female  Location: Left lower quadrant abdomen Duration: 2 weeks prior to arrival Timing: Worsening since onset Severity: 5-9/10 Quality: Aching/burning Context: Patient states she began having left lower quadrant abdominal pain that has been intermittent and "cramping" over the past 2 weeks that has been worsening Modifying factors: Denies Associated Symptoms: Vaginal bleeding ROS: Patient currently denies any vision changes, tinnitus, difficulty speaking, facial droop, sore throat, chest pain, shortness of breath, abdominal pain, nausea/vomiting/diarrhea, dysuria, or weakness/numbness/paresthesias in any extremity   Physical Exam  Triage Vital Signs: ED Triage Vitals  Enc Vitals Group     BP 12/19/21 0957 128/78     Pulse Rate 12/19/21 0957 99     Resp 12/19/21 0950 18     Temp 12/19/21 0957 98.7 F (37.1 C)     Temp Source 12/19/21 0957 Oral     SpO2 12/19/21 0950 98 %     Weight 12/19/21 0923 165 lb (74.8 kg)     Height 12/19/21 0923 5\' 2"  (1.575 m)     Head Circumference --      Peak Flow --      Pain Score 12/19/21 0922 5     Pain Loc --      Pain Edu? --      Excl. in GC? --    Most recent vital signs: Vitals:   12/19/21 0950 12/19/21 0957  BP:  128/78  Pulse:  99  Resp: 18 18  Temp:  98.7 F (37.1 C)  SpO2: 98%    General: Awake, oriented x4. CV:  Good peripheral perfusion.  Resp:  Normal effort.  Abd:  No distention.  Other:  Young adult Caucasian overweight female laying in bed in mild distress secondary to pain ED Results / Procedures / Treatments  Labs (all labs ordered are listed, but only abnormal results are displayed) Labs Reviewed  CBC - Abnormal; Notable for the following components:      Result Value   WBC 16.7 (*)    All other  components within normal limits  URINALYSIS, ROUTINE W REFLEX MICROSCOPIC - Abnormal; Notable for the following components:   Color, Urine YELLOW (*)    APPearance CLEAR (*)    Hgb urine dipstick MODERATE (*)    All other components within normal limits  CHLAMYDIA/NGC RT PCR ([facility] ONLY)            LIPASE, BLOOD  COMPREHENSIVE METABOLIC PANEL  POC URINE PREG, ED  RADIOLOGY ED MD interpretation: CT of the abdomen and pelvis with IV contrast interpreted by me shows left tubo-ovarian abscess, IUD out of position, and peripheral stranding in the left lower quadrant -Agree with radiology assessment Official radiology report(s): CT Abdomen Pelvis W Contrast  Result Date: 12/19/2021 CLINICAL DATA:  Abdominal pain, acute, nonlocalized EXAM: CT ABDOMEN AND PELVIS WITH CONTRAST TECHNIQUE: Multidetector CT imaging of the abdomen and pelvis was performed using the standard protocol following bolus administration of intravenous contrast. RADIATION DOSE REDUCTION: This exam was performed according to the departmental dose-optimization program which includes automated exposure control, adjustment of the mA and/or kV according to patient size and/or use of iterative reconstruction technique. CONTRAST:  12/21/2021 OMNIPAQUE IOHEXOL 300 MG/ML  SOLN COMPARISON:  CT abdomen pelvis 10/14/2013 FINDINGS: Lower chest: No acute abnormality. Hepatobiliary: No focal  liver abnormality is seen. The gallbladder is unremarkable. Pancreas: Unremarkable. No pancreatic ductal dilatation or surrounding inflammatory changes. Spleen: Normal in size without focal abnormality. Adrenals/Urinary Tract: Adrenal glands are unremarkable. No hydronephrosis. There are nonobstructive 2 mm stones in the right kidney. There is a small right lower pole renal cyst too small to characterize. Stomach/Bowel: The stomach is within normal limits. There is no evidence of bowel obstruction.Normal appendix. Mild reactive colitis of the sigmoid colon as it passes  the left adnexa. Vascular/Lymphatic: There is focal non-opacification and filling defects in the left ovarian vein (series 2 images 58-64). There is patency distally through its drainage into the left renal vein. No lymphadenopathy. Reproductive: The left adnexa demonstrates multiple cystic structures with heterogeneity and rim enhancement largest area measuring 3.0 x 2.2 x 2.6 cm (series 5, image 47, series 2, image 71). The bilateral adnexal veins are prominent. The right ovary appears normal. There is a malpositioned IUD in the lower uterine segment, upside down with T limbs in the region of the cervix. There is extensive pelvic soft tissue stranding with small volume pelvic free fluid and adjacent mild peritoneal thickening/enhancement. Other: No free air.  No hernia. Musculoskeletal: No acute osseous abnormality. No suspicious osseous lesion. IMPRESSION: Findings are most consistent with pelvic inflammatory disease with high suspicion for left-sided tubo-ovarian abscess. Extensive pelvic inflammatory stranding and small volume pelvic free fluid with adjacent peritoneal thickening/peritonitis. Short-segment left ovarian vein thrombosis/thrombophlebitis with patency distally through its drainage into the left renal vein. Malpositioned IUD in the lower uterine segment, upside down with T limbs in the region of the cervix. Short-segment reactive sigmoid colitis related to the left adnexal inflammatory process. Electronically Signed   By: Caprice Renshaw M.D.   On: 12/19/2021 10:56   PROCEDURES: Critical Care performed: Yes, see critical care procedure note(s) .1-3 Lead EKG Interpretation  Performed by: Merwyn Katos, MD Authorized by: Merwyn Katos, MD     Interpretation: normal     ECG rate:  95   ECG rate assessment: normal     Rhythm: sinus rhythm     Ectopy: none     Conduction: normal   CRITICAL CARE Performed by: Merwyn Katos  Total critical care time: 31 minutes  Critical care time was  exclusive of separately billable procedures and treating other patients.  Critical care was necessary to treat or prevent imminent or life-threatening deterioration.  Critical care was time spent personally by me on the following activities: development of treatment plan with patient and/or surrogate as well as nursing, discussions with consultants, evaluation of patient's response to treatment, examination of patient, obtaining history from patient or surrogate, ordering and performing treatments and interventions, ordering and review of laboratory studies, ordering and review of radiographic studies, pulse oximetry and re-evaluation of patient's condition.  MEDICATIONS ORDERED IN ED: Medications  doxycycline (VIBRAMYCIN) 100 mg in sodium chloride 0.9 % 250 mL IVPB (has no administration in time range)  metroNIDAZOLE (FLAGYL) IVPB 500 mg (500 mg Intravenous New Bag/Given 12/19/21 1129)  fentaNYL (SUBLIMAZE) injection 50 mcg (has no administration in time range)  ketorolac (TORADOL) 30 MG/ML injection 30 mg (has no administration in time range)  iohexol (OMNIPAQUE) 300 MG/ML solution 100 mL (100 mLs Intravenous Contrast Given 12/19/21 1026)  sodium chloride 0.9 % bolus 1,000 mL (1,000 mLs Intravenous New Bag/Given 12/19/21 1128)   IMPRESSION / MDM / ASSESSMENT AND PLAN / ED COURSE  I reviewed the triage vital signs and the nursing notes.  The patient is on the cardiac monitor to evaluate for evidence of arrhythmia and/or significant heart rate changes. Patient's presentation is most consistent with acute presentation with potential threat to life or bodily function. Patient is a 26 year old female who presents for left lower quadrant abdominal pain with associated vaginal bleeding that has just started.  Differential diagnosis includes but is not limited to PID, tubo-ovarian abscess, ruptured ectopic pregnancy, diverticulitis, infectious diarrhea Laboratory evaluation  significant for leukocytosis of 16.7 Urinalysis concerning for moderate hemoglobin CT of the abdomen and pelvis consistent with tubo-ovarian abscess with surrounding free fluid concerning for leaking/ruptured. Patient started on empiric antibiotics Patient remained hemodynamically stable throughout her emergency department course Consult: Spoke to Dr. Feliberto Gottron and OB/GYN who agrees with plan for admission for further evaluation and management Dispo: Admit Clinical Course as of 12/19/21 1155  Fri Dec 19, 2021  1103 CT Abdomen Pelvis W Contrast [EB]    Clinical Course User Index [EB] Merwyn Katos, MD   FINAL CLINICAL IMPRESSION(S) / ED DIAGNOSES   Final diagnoses:  Left tubo-ovarian abscess  Left lower quadrant abdominal pain   Rx / DC Orders   ED Discharge Orders     None      Note:  This document was prepared using Dragon voice recognition software and may include unintentional dictation errors.   Merwyn Katos, MD 12/19/21 [phone]

## 2021-12-19 NOTE — ED Notes (Signed)
See triage note  presents with intermittent lower back and lower abd pain  states sxs' started about 1 week ago  denies any fever  Was sent over from [facility]

## 2021-12-19 NOTE — H&P (Addendum)
Consult History and Physical   SERVICE: [facility]ne[facility] . Unassigned   Patient Name: Annette White Patient MRN:   [id number]  CC: 10 day of lower abdominal pain . Heavy menstrual flow - stopped .   HPI: ARRON MCNAUGHT is a 26 y.o. [id number] with progressive lower abd pain . No fever , no fever,  decreased appetite , + emesis daily .  Ctscan show probably left pyosalpinx    Review of Systems: positives in bold GEN:   fevers, chills, weight changes, appetite changes, fatigue, night sweats HEENT:  HA, vision changes, hearing loss, congestion, rhinorrhea, sinus pressure, dysphagia CV:   CP, palpitations PULM:  SOB, cough GI:  +abd pain, +N/V/D/C GU:  dysuria, urgency, frequency, +heavy menstrual flow  MSK:  arthralgias, myalgias, back pain, swelling SKIN:  rashes, color changes, pallor NEURO:  numbness, weakness, tingling, seizures, dizziness, tremors PSYCH:  depression, anxiety, behavioral problems, confusion  HEME/LYMPH:  easy bruising or bleeding ENDO:  heat/cold intolerance  Past Obstetrical History: OB History     Gravida  4   Para  4   Term  4   Preterm      AB      Living  4      SAB      IAB      Ectopic      Multiple  0   Live Births  4           Past Gynecologic History: Patient's last menstrual period was 11/20/2021 (exact date).  Past Medical History: Past Medical History:  Diagnosis Date   No pertinent past medical history    + prior chlamydia infection   Past Surgical History:   Past Surgical History:  Procedure Laterality Date   Denies surgical hx      Family History:  family history includes Diabetes in her maternal grandfather and mother; Hypertension in her father and mother; Prostate cancer in her maternal grandfather.  Social History:  Social History   Socioeconomic History   Marital status: Significant Other    Spouse name: Not on file   Number of children: 4   Years of education: 11   Highest education  level: 11th grade  Occupational History   Not on file  Tobacco Use   Smoking status: Former    Types: E-cigarettes, Cigars    Passive exposure: Current   Smokeless tobacco: Never  Vaping Use   Vaping Use: Former   Substances: Flavoring  Substance and Sexual Activity   Alcohol use: Not Currently    Comment: rare   Drug use: Yes    Types: Marijuana    Comment: Last marijuana use 07/21/2021 am.   Sexual activity: Yes    Partners: Male    Birth control/protection: I.U.D.  Other Topics Concern   Not on file  Social History Narrative   Not on file   Social Determinants of Health   Financial Resource Strain: Not on file  Food Insecurity: Not on file  Transportation Needs: Not on file  Physical Activity: Not on file  Stress: Not on file  Social Connections: Not on file  Intimate Partner Violence: Not At Risk (07/21/2021)   Humiliation, Afraid, Rape, and Kick questionnaire    Fear of Current or Ex-Partner: No    Emotionally Abused: No    Physically Abused: No    Sexually Abused: No    Home Medications:  Medications reconciled in EPIC  No current facility-administered medications on file prior to encounter.  Current Outpatient Medications on File Prior to Encounter  Medication Sig Dispense Refill   levonorgestrel (LILETTA, 52 MG,) 20.1 MCG/DAY IUD 1 each by Intrauterine route once. States inserted by [facility] 04/2015.     levonorgestrel (MIRENA) 20 MCG/24HR IUD 1 each by Intrauterine route once. (Patient not taking: Reported on 07/21/2021)     loratadine (CLARITIN) 10 MG tablet Take 1 tablet (10 mg total) by mouth daily for 30 days. 30 tablet 0   metroNIDAZOLE (FLAGYL) 500 MG tablet Take 1 tablet (500 mg total) by mouth 2 (two) times daily. (Patient not taking: Reported on 07/21/2021) 14 tablet 0   [DISCONTINUED] fluticasone (FLONASE) 50 MCG/ACT nasal spray Place 1 spray into both nostrils 2 (two) times daily. 16 g 0    Allergies:  Allergies  Allergen Reactions    Sulfamethoxazole-Trimethoprim Hives   Penicillins Rash    Has patient had a PCN reaction causing immediate rash, facial/tongue/throat swelling, SOB or lightheadedness with hypotension: Yes Has patient had a PCN reaction causing severe rash involving mucus membranes or skin necrosis: No Has patient had a PCN reaction that required hospitalization No Has patient had a PCN reaction occurring within the last 10 years: Yes If all of the above answers are "NO", then may proceed with Cephalosporin use.    Physical Exam:  Temp:  [98.7 F (37.1 C)] 98.7 F (37.1 C) (06/30 0957) Pulse Rate:  [99] 99 (06/30 0957) Resp:  [18] 18 (06/30 0957) BP: (128)/(78) 128/78 (06/30 0957) SpO2:  [98 %] 98 % (06/30 0950) Weight:  [74.8 kg] 74.8 kg (06/30 0923)   General Appearance:  Well developed, well nourished, no acute distress, alert and oriented x3 HEENT:  Normocephalic atraumatic, extraocular movements intact, moist mucous membranes Cardiovascular:  Normal S1/S2, regular rate and rhythm, no murmurs Pulmonary:  clear to auscultation, no wheezes, rales or rhonchi, symmetric air entry, good air exchange Abdomen:  Bowel sounds present, soft, mild TTP , no rebound TTP , nondistended, no abnormal masses, no epigastric pain Extremities:  Full range of motion, no pedal edema, 2+ distal pulses, no tenderness Skin:  normal coloration and turgor, no rashes, no suspicious skin lesions noted  Neurologic:  Cranial nerves 2-12 grossly intact, normal muscle tone, strength 5/5 all four extremities Psychiatric:  Normal mood and affect, appropriate, no AH/VH Pelvic:  deferred   Labs/Studies:   CBC and Coags:  Lab Results  Component Value Date   WBC 16.7 (H) 12/19/2021   NEUTOPHILPCT 90 04/20/2020   EOSPCT 0 04/20/2020   BASOPCT 0 04/20/2020   LYMPHOPCT 6 04/20/2020   HGB 12.0 12/19/2021   HCT 37.4 12/19/2021   MCV 89.7 12/19/2021   PLT 327 12/19/2021   INR 1.1 04/20/2020   CMP:  Lab Results  Component  Value Date   NA 137 12/19/2021   K 3.7 12/19/2021   CL 104 12/19/2021   CO2 24 12/19/2021   BUN 7 12/19/2021   CREATININE 0.59 12/19/2021   CREATININE 0.70 04/20/2020   CREATININE 0.62 09/13/2018   PROT 8.1 12/19/2021   BILITOT 0.4 12/19/2021   ALT 18 12/19/2021   AST 26 12/19/2021   ALKPHOS 52 12/19/2021    Other Imaging: CT Abdomen Pelvis W Contrast  Result Date: 12/19/2021 CLINICAL DATA:  Abdominal pain, acute, nonlocalized EXAM: CT ABDOMEN AND PELVIS WITH CONTRAST TECHNIQUE: Multidetector CT imaging of the abdomen and pelvis was performed using the standard protocol following bolus administration of intravenous contrast. RADIATION DOSE REDUCTION: This exam was performed according to the departmental  dose-optimization program which includes automated exposure control, adjustment of the mA and/or kV according to patient size and/or use of iterative reconstruction technique. CONTRAST:  OMNIPAQUE IOHEXOL 300 MG/ML  SOLN COMPARISON:  CT abdomen pelvis 10/14/2013 FINDINGS: Lower chest: No acute abnormality. Hepatobiliary: No focal liver abnormality is seen. The gallbladder is unremarkable. Pancreas: Unremarkable. No pancreatic ductal dilatation or surrounding inflammatory changes. Spleen: Normal in size without focal abnormality. Adrenals/Urinary Tract: Adrenal glands are unremarkable. No hydronephrosis. There are nonobstructive 2 mm stones in the right kidney. There is a small right lower pole renal cyst too small to characterize. Stomach/Bowel: The stomach is within normal limits. There is no evidence of bowel obstruction.Normal appendix. Mild reactive colitis of the sigmoid colon as it passes the left adnexa. Vascular/Lymphatic: There is focal non-opacification and filling defects in the left ovarian vein (series 2 images 58-64). There is patency distally through its drainage into the left renal vein. No lymphadenopathy. Reproductive: The left adnexa demonstrates multiple cystic structures  with heterogeneity and rim enhancement largest area measuring 3.0 x 2.2 x 2.6 cm (series 5, image 47, series 2, image 71). The bilateral adnexal veins are prominent. The right ovary appears normal. There is a malpositioned IUD in the lower uterine segment, upside down with T limbs in the region of the cervix. There is extensive pelvic soft tissue stranding with small volume pelvic free fluid and adjacent mild peritoneal thickening/enhancement. Other: No free air.  No hernia. Musculoskeletal: No acute osseous abnormality. No suspicious osseous lesion. IMPRESSION: Findings are most consistent with pelvic inflammatory disease with high suspicion for left-sided tubo-ovarian abscess. Extensive pelvic inflammatory stranding and small volume pelvic free fluid with adjacent peritoneal thickening/peritonitis. Short-segment left ovarian vein thrombosis/thrombophlebitis with patency distally through its drainage into the left renal vein. Malpositioned IUD in the lower uterine segment, upside down with T limbs in the region of the cervix. Short-segment reactive sigmoid colitis related to the left adnexal inflammatory process. Electronically Signed   By: Caprice Renshaw M.D.   On: 12/19/2021 10:56     Assessment / Plan:   NATANIA FINIGAN is a 26 y.o. [id number] who presents with   1. 10 days of increasing abdominal  CTscan c/w with left pyosalpinx  and malpositioned IUD  Given elevated WBC I will admit and startIV abx  cefoxitin 2 gm q 6 hrs and doxycycline 100 mg bid   Toradol 30 mg IV prn  pain . + narco 5/325/ if additional pain meds needed  Serial cbc , one WBC normalizes d/c on po meds  Pt declines removal of the IUD now . She is aware that sexual activity if not recommended until IUD issue is addressed and additional contraception if started    GC/ chlamydia pending   Jennell Corner MD  [facility]   Thank you for the opportunity to be involved with this pt's care.

## 2021-12-20 LAB — CBC
HCT: 31.4 % — ABNORMAL LOW (ref 36.0–46.0)
Hemoglobin: 10.3 g/dL — ABNORMAL LOW (ref 12.0–15.0)
MCH: 28.9 pg (ref 26.0–34.0)
MCHC: 32.8 g/dL (ref 30.0–36.0)
MCV: 88.2 fL (ref 80.0–100.0)
Platelets: 277 10*3/uL (ref 150–400)
RBC: 3.56 MIL/uL — ABNORMAL LOW (ref 3.87–5.11)
RDW: 11.9 % (ref 11.5–15.5)
WBC: 11.9 10*3/uL — ABNORMAL HIGH (ref 4.0–10.5)
nRBC: 0 % (ref 0.0–0.2)

## 2021-12-20 LAB — BASIC METABOLIC PANEL
Anion gap: 4 — ABNORMAL LOW (ref 5–15)
BUN: 6 mg/dL (ref 6–20)
CO2: 24 mmol/L (ref 22–32)
Calcium: 8.2 mg/dL — ABNORMAL LOW (ref 8.9–10.3)
Chloride: 109 mmol/L (ref 98–111)
Creatinine, Ser: 0.74 mg/dL (ref 0.44–1.00)
GFR, Estimated: >60 mL/min (ref >60)
Glucose, Bld: 92 mg/dL (ref 70–99)
Potassium: 3.6 mmol/L (ref 3.5–5.1)
Sodium: 137 mmol/L (ref 135–145)

## 2021-12-20 MED ORDER — CLONAZEPAM 0.5 MG PO TABS: 0.5000 mg | ORAL_TABLET | Freq: Two times a day (BID) | ORAL | Status: DC

## 2021-12-20 MED ORDER — OXYCODONE HCL 5 MG PO TABS
5.0000 mg | ORAL_TABLET | ORAL | Status: DC | PRN
  Administered 2021-12-20 (×2): 5 mg via ORAL
  Filled 2021-12-20 (×2): qty 1

## 2021-12-20 MED ORDER — CLONAZEPAM 0.5 MG PO TABS
0.5000 mg | ORAL_TABLET | Freq: Two times a day (BID) | ORAL | Status: DC
  Administered 2021-12-20 – 2021-12-21 (×2): 0.5 mg via ORAL
  Filled 2021-12-20 (×2): qty 1

## 2021-12-20 MED ORDER — ACETAMINOPHEN 500 MG PO TABS
1000.0000 mg | ORAL_TABLET | Freq: Four times a day (QID) | ORAL | Status: DC
  Administered 2021-12-21 (×2): 1000 mg via ORAL
  Filled 2021-12-20 (×2): qty 2

## 2021-12-20 NOTE — Progress Notes (Addendum)
Patient transported via wheelchair to [facility] for procedure with Dr. Feliberto Gottron and CNM. This RN remained with patient and escorted patient back to rm 348 post-procedure.  Pain 8/10. Roxi changed to Q4 PRN.

## 2021-12-20 NOTE — Progress Notes (Signed)
Subjective:HD #2 for left pyosalpinx . +Chlamydia  On Cefoxitin and Doxycycline IV   Malpositioned IUD on u/s  Patient reports pt states pain is improving .   Still requiring narcotic for pain   Objective: I have reviewed patient's vital signs, medications, and labs.  General: alert and cooperative Resp: clear to auscultation bilaterally GI: non distended , soft . Mild TTP LLQ  Pelvic: v/v normal . No abnormal d/c  Cervix IUD strings not seen  Verbal consent for IUD removal obtained : Single tooth tenaculum placed  Cx is dilated  Packing forceps placed into LUS and IUD is removed intact  No complication  Results for orders placed or performed during the hospital encounter of 12/19/21 (from the past 24 hour(s))  CBC     Status: Abnormal   Collection Time: 12/20/21  4:37 AM  Result Value Ref Range   WBC 11.9 (H) 4.0 - 10.5 K/uL   RBC 3.56 (L) 3.87 - 5.11 MIL/uL   Hemoglobin 10.3 (L) 12.0 - 15.0 g/dL   HCT 17.7 (L) 93.9 - 03.0 %   MCV 88.2 80.0 - 100.0 fL   MCH 28.9 26.0 - 34.0 pg   MCHC 32.8 30.0 - 36.0 g/dL   RDW 09.2 33.0 - 07.6 %   Platelets 277 150 - 400 K/uL   nRBC 0.0 0.0 - 0.2 %  Basic metabolic panel     Status: Abnormal   Collection Time: 12/20/21  4:37 AM  Result Value Ref Range   Sodium 137 135 - 145 mmol/L   Potassium 3.6 3.5 - 5.1 mmol/L   Chloride 109 98 - 111 mmol/L   CO2 24 22 - 32 mmol/L   Glucose, Bld 92 70 - 99 mg/dL   BUN 6 6 - 20 mg/dL   Creatinine, Ser 2.26 0.44 - 1.00 mg/dL   Calcium 8.2 (L) 8.9 - 10.3 mg/dL   GFR, Estimated >33 >35 mL/min   Anion gap 4 (L) 5 - 15    Assessment/Plan: Left pyosalpinx, with + chlamydia . All c/w PID Clinically improving , decreasing WBC  IUD removed for malposition and infection   Continue ABX and possible d/c tomorrow on home po doxycycline    LOS: 1 day    Suzy Bouchard, MD 12/20/2021, 11:30 AM

## 2021-12-21 LAB — CBC
HCT: 33.7 % — ABNORMAL LOW (ref 36.0–46.0)
Hemoglobin: 11 g/dL — ABNORMAL LOW (ref 12.0–15.0)
MCH: 28.8 pg (ref 26.0–34.0)
MCHC: 32.6 g/dL (ref 30.0–36.0)
MCV: 88.2 fL (ref 80.0–100.0)
Platelets: 312 10*3/uL (ref 150–400)
RBC: 3.82 MIL/uL — ABNORMAL LOW (ref 3.87–5.11)
RDW: 11.8 % (ref 11.5–15.5)
WBC: 11.8 10*3/uL — ABNORMAL HIGH (ref 4.0–10.5)
nRBC: 0 % (ref 0.0–0.2)

## 2021-12-21 MED ORDER — IBUPROFEN 800 MG PO TABS: 800.0000 mg | ORAL_TABLET | Freq: Three times a day (TID) | ORAL | 0 refills | Status: DC | PRN

## 2021-12-21 MED ORDER — OXYCODONE-ACETAMINOPHEN 5-325 MG PO TABS: 1.0000 | ORAL_TABLET | Freq: Three times a day (TID) | ORAL | 0 refills | Status: AC | PRN

## 2021-12-21 MED ORDER — DOXYCYCLINE HYCLATE 100 MG PO CAPS: 100.0000 mg | ORAL_CAPSULE | Freq: Two times a day (BID) | ORAL | 0 refills | Status: AC

## 2021-12-21 MED ORDER — ONDANSETRON HCL 4 MG PO TABS: 4.0000 mg | ORAL_TABLET | Freq: Three times a day (TID) | ORAL | 0 refills | Status: DC | PRN

## 2021-12-21 NOTE — Progress Notes (Signed)
Patient educated on discharge instructions, medications, and follow up appointments. Patient verbalized understanding. Patient will call out when ride home is here.

## 2021-12-21 NOTE — Discharge Summary (Signed)
Physician Discharge Summary  Patient ID: Annette White MRN: [id number] DOB/AGE: 16-Nov-1995 26 y.o.  Admit date: 12/19/2021 Discharge date: 12/26/2021  Admission Diagnoses: PID , left pyosalpinx, + chlamydia   Malpositioned IUD Discharge Diagnoses:  Principal Problem:   Left pyosalpinx Same as above   Discharged Condition: good  Hospital Course: pt was admitted for 2 days of IV abx cefoxitin 2 gm q 6 hrs and 100 mg Doxycycline bid  Pelvic pain improved . No fever on d/c . Diet regular    On HD#2 IUD was removed secondary to malposition of IUD and infection   Consults: None  Significant Diagnostic Studies:  No results found for this or any previous visit (from the past 72 hour(s)).    Treatments: IV abx as above   Discharge Exam: Blood pressure 110/68, pulse 86, temperature 97.8 F (36.6 C), temperature source Oral, resp. rate 18, height 5\' 2"  (1.575 m), weight 74.8 kg, last menstrual period 11/20/2021, SpO2 100 %. General appearance: alert and cooperative Resp: clear to auscultation bilaterally Cardio: regular rate and rhythm, S1, S2 normal, no murmur, click, rub or gallop GI: mild LLQ TTP . Soft non distended   Disposition: doxycycline for 10 days   Discharge Instructions     Call MD for:   Complete by: As directed    Increasing pelvic pain   Call MD for:  difficulty breathing, headache or visual disturbances   Complete by: As directed    Call MD for:  extreme fatigue   Complete by: As directed    Call MD for:  hives   Complete by: As directed    Call MD for:  persistant dizziness or light-headedness   Complete by: As directed    Call MD for:  persistant nausea and vomiting   Complete by: As directed    Call MD for:  redness, tenderness, or signs of infection (pain, swelling, redness, odor or green/yellow discharge around incision site)   Complete by: As directed    Call MD for:  severe uncontrolled pain   Complete by: As directed    Call MD for:   temperature >100.4   Complete by: As directed    Diet general   Complete by: As directed    Increase activity slowly   Complete by: As directed       Allergies as of 12/21/2021       Reactions   Sulfamethoxazole-trimethoprim Hives   Penicillins Rash   Has patient had a PCN reaction causing immediate rash, facial/tongue/throat swelling, SOB or lightheadedness with hypotension: Yes Has patient had a PCN reaction causing severe rash involving mucus membranes or skin necrosis: No Has patient had a PCN reaction that required hospitalization No Has patient had a PCN reaction occurring within the last 10 years: Yes If all of the above answers are "NO", then may proceed with Cephalosporin use.        Medication List     STOP taking these medications    levonorgestrel 20 MCG/24HR IUD Commonly known as: MIRENA   Liletta (52 MG) 20.1 MCG/DAY Iud IUD Generic drug: levonorgestrel   metroNIDAZOLE 500 MG tablet Commonly known as: FLAGYL       TAKE these medications    doxycycline 100 MG capsule Commonly known as: VIBRAMYCIN Take 1 capsule (100 mg total) by mouth 2 (two) times daily for 10 days.   ibuprofen 800 MG tablet Commonly known as: ADVIL Take 1 tablet (800 mg total) by mouth every 8 (eight) hours as  needed.   ondansetron 4 MG tablet Commonly known as: Zofran Take 1 tablet (4 mg total) by mouth every 8 (eight) hours as needed for up to 12 doses for nausea or vomiting.   oxyCODONE-acetaminophen 5-325 MG tablet Commonly known as: Percocet Take 1 tablet by mouth every 8 (eight) hours as needed for severe pain.        Follow-up Information     Koy Lamp, Ihor Austin, MD. Schedule an appointment as soon as possible for a visit in 10 day(s).   Specialty: Obstetrics and Gynecology Contact information: [address] [place] [address] [phone]                 Signed: Ihor Austin Annette White 12/26/2021, 3:41 AM

## 2021-12-26 NOTE — Discharge Summary (Signed)
Rosalynd Mcwright, Ihor Austin, MD Physician Outpatient Discharge Summary    Incomplete Date of Service:  12/21/2021  1:43 PM   Incomplete      Expand All Collapse All                                                                                                                                                                                                                                                                                         Physician Discharge Summary  Patient ID: Annette White MRN: [id number] DOB/AGE: 1996-02-02 26 y.o.   Admit date: 12/19/2021 Discharge date: 12/21/2021   Admission Diagnoses: PID , left pyosalpinx, + chlamydia   Malpositioned IUD Discharge Diagnoses:  Principal Problem:   Left pyosalpinx Same as above    Discharged Condition: good   Hospital Course: pt was admitted for 2 days of IV abx cefoxitin 2 gm q 6 hrs and 100 mg Doxycycline bid  Pelvic pain improved . No fever on d/c . Diet regular     On HD#2 IUD was removed secondary to malposition of IUD and infection    Consults: None   Significant Diagnostic Studies:  Lab Results Past 72 Hours        Results for orders placed or performed during the hospital encounter of 12/19/21 (from the past 72 hour(s))  Lipase, blood     Status: None    Collection Time: 12/19/21  9:25 AM  Result Value Ref Range    Lipase 27 11 - 51 U/L      Comment: Performed at [facility], [address] [place], [place] [address]  Comprehensive metabolic panel     Status: None    Collection Time: 12/19/21  9:25 AM  Result Value Ref Range    Sodium 137 135 - 145 mmol/L    Potassium 3.7 3.5 - 5.1 mmol/L    Chloride 104 98 - 111 mmol/L    CO2 24 22 - 32 mmol/L    Glucose, Bld 99 70 - 99 mg/dL      Comment: Glucose reference range applies only to samples taken after fasting for at least 8 hours.  BUN 7 6 - 20  mg/dL    Creatinine, Ser 3.55 0.44 - 1.00 mg/dL    Calcium 9.1 8.9 - 73.2 mg/dL    Total Protein 8.1 6.5 - 8.1 g/dL    Albumin 4.0 3.5 - 5.0 g/dL    AST 26 15 - 41 U/L    ALT 18 0 - 44 U/L    Alkaline Phosphatase 52 38 - 126 U/L    Total Bilirubin 0.4 0.3 - 1.2 mg/dL    GFR, Estimated >20 >25 mL/min      Comment: (NOTE) Calculated using the CKD-EPI Creatinine Equation (2021)      Anion gap 9 5 - 15      Comment: Performed at [facility], [address] [place], [place] [address]  CBC     Status: Abnormal    Collection Time: 12/19/21  9:25 AM  Result Value Ref Range    WBC 16.7 (H) 4.0 - 10.5 K/uL    RBC 4.17 3.87 - 5.11 MIL/uL    Hemoglobin 12.0 12.0 - 15.0 g/dL    HCT 23.7 62.8 - 31.5 %    MCV 89.7 80.0 - 100.0 fL    MCH 28.8 26.0 - 34.0 pg    MCHC 32.1 30.0 - 36.0 g/dL    RDW 17.6 16.0 - 73.7 %    Platelets 327 150 - 400 K/uL    nRBC 0.0 0.0 - 0.2 %      Comment: Performed at [facility], [address] [place], [place] [address]  Urinalysis, Routine w reflex microscopic Urine, Clean Catch     Status: Abnormal    Collection Time: 12/19/21  9:25 AM  Result Value Ref Range    Color, Urine YELLOW (A) YELLOW    APPearance CLEAR (A) CLEAR    Specific Gravity, Urine 1.011 1.005 - 1.030    pH 7.0 5.0 - 8.0    Glucose, UA NEGATIVE NEGATIVE mg/dL    Hgb urine dipstick MODERATE (A) NEGATIVE    Bilirubin Urine NEGATIVE NEGATIVE    Ketones, ur NEGATIVE NEGATIVE mg/dL    Protein, ur NEGATIVE NEGATIVE mg/dL    Nitrite NEGATIVE NEGATIVE    Leukocytes,Ua NEGATIVE NEGATIVE    RBC / HPF 0-5 0 - 5 RBC/hpf    WBC, UA 0-5 0 - 5 WBC/hpf    Bacteria, UA NONE SEEN NONE SEEN    Squamous Epithelial / LPF 0-5 0 - 5    Mucus PRESENT        Comment: Performed at [facility], [address] [place], [place] [address]  Chlamydia/NGC rt PCR [facility] only)     Status: Abnormal    Collection Time: 12/19/21  9:25 AM    Specimen: Urine  Result Value Ref Range     Specimen source GC/Chlam URINE, RANDOM      Chlamydia Tr DETECTED (A) NOT DETECTED    N gonorrhoeae NOT DETECTED NOT DETECTED      Comment: (NOTE) This CT/NG assay has not been evaluated in patients with a history of  hysterectomy. Performed at [facility], [address] [place], [place] [address]    POC urine preg, ED     Status: None    Collection Time: 12/19/21  9:34 AM  Result Value Ref Range    Preg Test, Ur Negative Negative  CBC     Status: Abnormal    Collection Time: 12/20/21  4:37 AM  Result Value Ref Range    WBC  11.9 (H) 4.0 - 10.5 K/uL    RBC 3.56 (L) 3.87 - 5.11 MIL/uL    Hemoglobin 10.3 (L) 12.0 - 15.0 g/dL    HCT 25.3 (L) 66.4 - 46.0 %    MCV 88.2 80.0 - 100.0 fL    MCH 28.9 26.0 - 34.0 pg    MCHC 32.8 30.0 - 36.0 g/dL    RDW 40.3 47.4 - 25.9 %    Platelets 277 150 - 400 K/uL    nRBC 0.0 0.0 - 0.2 %      Comment: Performed at [facility], [address], [place], [place] [address]  Basic metabolic panel     Status: Abnormal    Collection Time: 12/20/21  4:37 AM  Result Value Ref Range    Sodium 137 135 - 145 mmol/L    Potassium 3.6 3.5 - 5.1 mmol/L    Chloride 109 98 - 111 mmol/L    CO2 24 22 - 32 mmol/L    Glucose, Bld 92 70 - 99 mg/dL      Comment: Glucose reference range applies only to samples taken after fasting for at least 8 hours.    BUN 6 6 - 20 mg/dL    Creatinine, Ser 5.64 0.44 - 1.00 mg/dL    Calcium 8.2 (L) 8.9 - 10.3 mg/dL    GFR, Estimated >33 >29 mL/min      Comment: (NOTE) Calculated using the CKD-EPI Creatinine Equation (2021)      Anion gap 4 (L) 5 - 15      Comment: Performed at [facility], [address] [place], [place] [address]  CBC     Status: Abnormal    Collection Time: 12/21/21  6:49 AM  Result Value Ref Range    WBC 11.8 (H) 4.0 - 10.5 K/uL    RBC 3.82 (L) 3.87 - 5.11 MIL/uL    Hemoglobin 11.0 (L) 12.0 - 15.0 g/dL    HCT 16.6 (L) 06.3 - 46.0 %    MCV 88.2 80.0 - 100.0 fL    MCH 28.8  26.0 - 34.0 pg    MCHC 32.6 30.0 - 36.0 g/dL    RDW 01.6 01.0 - 93.2 %    Platelets 312 150 - 400 K/uL    nRBC 0.0 0.0 - 0.2 %      Comment: Performed at [facility], [address] [place], [place] [address]        Treatments: IV abx as above    Discharge Exam: Blood pressure 110/68, pulse 86, temperature 97.8 F (36.6 C), temperature source Oral, resp. rate 18, height 5\' 2"  (1.575 m), weight 74.8 kg, last menstrual period 11/20/2021, SpO2 100 %. General appearance: alert and cooperative Resp: clear to auscultation bilaterally Cardio: regular rate and rhythm, S1, S2 normal, no murmur, click, rub or gallop GI: mild LLQ TTP . Soft non distended    Disposition: doxycycline for 10 days    Discharge Instructions       Call MD for:   Complete by: As directed      Increasing pelvic pain    Call MD for:  difficulty breathing, headache or visual disturbances   Complete by: As directed      Call MD for:  extreme fatigue   Complete by: As directed      Call MD for:  hives   Complete by: As directed      Call MD for:  persistant dizziness or light-headedness   Complete by:  As directed      Call MD for:  persistant nausea and vomiting   Complete by: As directed      Call MD for:  redness, tenderness, or signs of infection (pain, swelling, redness, odor or green/yellow discharge around incision site)   Complete by: As directed      Call MD for:  severe uncontrolled pain   Complete by: As directed      Call MD for:  temperature >100.4   Complete by: As directed      Diet general   Complete by: As directed      Increase activity slowly   Complete by: As directed           Allergies as of 12/21/2021         Reactions    Sulfamethoxazole-trimethoprim Hives    Penicillins Rash    Has patient had a PCN reaction causing immediate rash, facial/tongue/throat swelling, SOB or lightheadedness with hypotension: Yes Has patient had a PCN reaction causing severe rash involving  mucus membranes or skin necrosis: No Has patient had a PCN reaction that required hospitalization No Has patient had a PCN reaction occurring within the last 10 years: Yes If all of the above answers are "NO", then may proceed with Cephalosporin use.                    Follow-up Information       Dorita Rowlands, Ihor Austin, MD Follow up.   Specialty: Obstetrics and Gynecology Contact information: [address] [place] [address] [phone]                          Signed: Ihor Austin Abiageal Blowe 12/21/2021, 1:48 PM            ED to Hosp-Admission (Discharged) on 12/19/2021  ED to Hosp-Admission (Discharged) on 12/19/2021   Detailed Report  Additional Orders and Documentation   Results Imaging Microbiology   Meds    Orders Procedures   Flowsheets   Encounter Info:  History, Allergies, Education, Care Plan, Detailed Report    Hospital Problem List   Left pyosalpinx Care Timeline  06/30   Admitted from ED 1345  07/02   Discharged 1400   Discharge   01-Home or Self Care    IP After Visit Summary (Printed 12/21/2021)   Follow-Ups: Schedule an appointment with Jeyla Bulger, Ihor Austin, MD (Obstetrics and Gynecology) in 10 days (12/31/2021) Medication List at Discharge   Doxycycline Hyclate 100 mg Oral 2 times daily  Ibuprofen 800 mg Oral Every 8 hours PRN  Ondansetron HCl 4 mg Oral Every 8 hours PRN  oxyCODONE-Acetaminophen 5-325 MG 1 tablet Oral Every 8 hours PRN

## 2022-03-31 ENCOUNTER — Encounter: Payer: Self-pay | Admitting: Advanced Practice Midwife

## 2022-03-31 ENCOUNTER — Ambulatory Visit (LOCAL_COMMUNITY_HEALTH_CENTER): Payer: 59 | Admitting: Advanced Practice Midwife

## 2022-03-31 ENCOUNTER — Ambulatory Visit: Payer: 59 | Admitting: Advanced Practice Midwife

## 2022-03-31 VITALS — BP 102/69 | Ht 62.0 in | Wt 164.4 lb

## 2022-03-31 DIAGNOSIS — Z3202 Encounter for pregnancy test, result negative: Secondary | ICD-10-CM

## 2022-03-31 DIAGNOSIS — Z113 Encounter for screening for infections with a predominantly sexual mode of transmission: Secondary | ICD-10-CM

## 2022-03-31 DIAGNOSIS — F17200 Nicotine dependence, unspecified, uncomplicated: Secondary | ICD-10-CM

## 2022-03-31 DIAGNOSIS — Z30011 Encounter for initial prescription of contraceptive pills: Secondary | ICD-10-CM

## 2022-03-31 DIAGNOSIS — Z3009 Encounter for other general counseling and advice on contraception: Secondary | ICD-10-CM | POA: Diagnosis not present

## 2022-03-31 DIAGNOSIS — A599 Trichomoniasis, unspecified: Secondary | ICD-10-CM | POA: Insufficient documentation

## 2022-03-31 DIAGNOSIS — Z5321 Procedure and treatment not carried out due to patient leaving prior to being seen by health care provider: Secondary | ICD-10-CM

## 2022-03-31 DIAGNOSIS — Z3200 Encounter for pregnancy test, result unknown: Secondary | ICD-10-CM

## 2022-03-31 LAB — WET PREP FOR TRICH, YEAST, CLUE
Trichomonas Exam: POSITIVE — AB
Yeast Exam: NEGATIVE

## 2022-03-31 LAB — PREGNANCY, URINE: Preg Test, Ur: NEGATIVE

## 2022-03-31 MED ORDER — METRONIDAZOLE 500 MG PO TABS: 500.0000 mg | ORAL_TABLET | Freq: Two times a day (BID) | ORAL | 0 refills | Status: AC

## 2022-03-31 MED ORDER — DOXYCYCLINE HYCLATE 100 MG PO TABS: 100.0000 mg | ORAL_TABLET | Freq: Two times a day (BID) | ORAL | 0 refills | Status: AC

## 2022-03-31 MED ORDER — NORGESTIM-ETH ESTRAD TRIPHASIC 0.18/0.215/0.25 MG-25 MCG PO TABS: 1.0000 | ORAL_TABLET | Freq: Every day | ORAL | 2 refills | Status: DC

## 2022-03-31 NOTE — Progress Notes (Signed)
[facility] Main Number: [id number]  Contraception/Family Planning VISIT ENCOUNTER NOTE  Subjective:   Annette White is a 26 y.o. SWF vaper [phone] female here for reproductive life counseling. The patient is currently using Abstinence to prevent pregnancy.   Pt here because her partner told her last week he has chlamydia and trich. She wants STD testing and birth control. C/o cough, runny nose since last week. Went to ER 12/19/21 with abdominal pain, Chlamydia, PID, CT revealed left pyosalpinx and admitted x 2 days with IV cefoxitin, IUD removed due to malposition of IUD and infection. Last sex 03/03/22 without condom; with current partner off and on x 9 years. Last pap 07/21/21 neg. Last physical 07/21/21.  The patient does not want a pregnancy in the next year.  LMP 03/02/22  Client states they are looking for the following:  High efficacy at preventing pregnancy  Denies abnormal vaginal bleeding, discharge, pelvic pain, problems with intercourse or other gynecologic concerns.    Gynecologic History Patient's last menstrual period was 03/02/2022 (approximate).  Health Maintenance Due  Topic Date Due   COVID-19 Vaccine (1) Never done   PAP-Cervical Cytology Screening  Never done   INFLUENZA VACCINE  Never done     The following portions of the patient's history were reviewed and updated as appropriate: allergies, current medications, past family history, past medical history, past social history, past surgical history and problem list.  Review of Systems Pertinent items are noted in HPI.   Objective:  BP 102/69   Ht 5\' 2"  (1.575 m)   Wt 164 lb 6.4 oz (74.6 kg)   LMP 03/02/2022 (Approximate)   BMI 30.07 kg/m  Gen: well appearing, NAD HEENT: no scleral icterus CV: RR Lung: Normal WOB Ext: warm well perfused  PELVIC: Normal appearing external genitalia; normal appearing vaginal mucosa and cervix.   Grey leukorrhea, ph>4.5  Normal uterine size, no other palpable masses, no uterine or adnexal tenderness.   Assessment and Plan:   Need for emergency contraceptive care was assessed today.  Last unprotected sex was:  03/03/22 without condom; with current partner off and on x 9 years  Patient reported > 120 hours .  Reviewed options and patient desired No method of ECP, declined all    Contraception counseling: Reviewed methods in a patient centered fashion and used shared decision making with the patient. Utilized [employer] patient education tools as appropriate. The patient stated there goals and desires from a method are: High efficacy at preventing pregnancy  We reviewed the following methods in detail based on patient preferences available included: Hormonal Implant, Hormonal Injection, Oral Contraceptive, and Vaginal Ring  Patient expressed they would like Oral Contraceptive  This was provided to the patient today.  if not why not clearly documented  Risks, benefits, and typical effectiveness rates were reviewed.  Questions were answered.  Written information was also given to the patient to review.       will follow up in  3 months for surveillance.   was told to call with any further questions, or with any concerns about this method of contraception or cycle control.  Emphasized use of condoms 100% of the time for STI prevention.   1. Possible pregnancy PT neg today - Pregnancy, urine  2. Screening examination for venereal disease Treat wet mount per standing orders Immunization nurse consult Treat as contact to Chlamydia please per standing orders  - WET PREP FOR [place], YEAST,  [facility]  3. Family planning See below  4. Encounter for initial prescription of contraceptive pills Tri Lo Sprintec #3 I po daily to begin today Please counsel on need for abstinance/back up condoms next 7 days  - Norgestimate-Ethinyl Estradiol Triphasic  (TRI-LO-SPRINTEC) 0.18/0.215/0.25 MG-25 MCG tab; Take 1 tablet by mouth daily.  Dispense: 28 tablet; Refill: 2  5. Trichomonas infection 03/31/22 Treat per standing orders Please give contact card to pt    Please refer to After Visit Summary for other counseling recommendations.   Return if symptoms worsen or fail to improve.  Herbie Saxon, [place]

## 2022-03-31 NOTE — Progress Notes (Signed)
Patient here for [facility] and STD testing. States she is a contact to trich and chlamydia. Unsure about BCM, considering patch.Jenetta Downer, RN

## 2022-03-31 NOTE — Progress Notes (Signed)
Patient treated as contact to Chlamydia and Trich and given 3 packs OC per provider orders. Patient counseled no sex for 7 days until after finishing antibiotics, then sex with condom for 7 days. Patient states understanding about taking OC same time every day. Counseled to call for appointment when she starts pack #3, or before that if she wants to switch methods.Jenetta Downer, RN

## 2022-04-01 NOTE — Progress Notes (Signed)
DNKA

## 2023-01-04 ENCOUNTER — Ambulatory Visit: Payer: 59 | Admitting: Nurse Practitioner

## 2023-01-04 ENCOUNTER — Encounter: Payer: Self-pay | Admitting: Nurse Practitioner

## 2023-01-04 VITALS — BP 128/78 | HR 99 | Temp 98.7°F | Ht 61.0 in | Wt 173.0 lb

## 2023-01-04 DIAGNOSIS — R110 Nausea: Secondary | ICD-10-CM | POA: Diagnosis not present

## 2023-01-04 DIAGNOSIS — Z7689 Persons encountering health services in other specified circumstances: Secondary | ICD-10-CM | POA: Insufficient documentation

## 2023-01-04 DIAGNOSIS — N898 Other specified noninflammatory disorders of vagina: Secondary | ICD-10-CM | POA: Diagnosis not present

## 2023-01-04 DIAGNOSIS — R8290 Unspecified abnormal findings in urine: Secondary | ICD-10-CM | POA: Diagnosis not present

## 2023-01-04 DIAGNOSIS — M545 Low back pain, unspecified: Secondary | ICD-10-CM | POA: Diagnosis not present

## 2023-01-04 LAB — POCT URINE PREGNANCY: Preg Test, Ur: NEGATIVE

## 2023-01-04 LAB — POCT URINALYSIS DIPSTICK
Bilirubin, UA: NEGATIVE
Glucose, UA: NEGATIVE
Ketones, UA: NEGATIVE
Leukocytes, UA: NEGATIVE
Nitrite, UA: NEGATIVE
Protein, UA: POSITIVE — AB
Spec Grav, UA: >=1.03 — AB (ref 1.010–1.025)
Urobilinogen, UA: 0.2 E.U./dL
pH, UA: 6 (ref 5.0–8.0)

## 2023-01-04 NOTE — Assessment & Plan Note (Signed)
Patient to establish care with new healthcare provider

## 2023-01-04 NOTE — Assessment & Plan Note (Signed)
No injury do feel that this is musculoskeletal in nature.  Offered patient muscle relaxants but she would like to hold off for now.  Did give her some lumbar strain rehab exercises to do for patient not to do them if it causes additional pain

## 2023-01-04 NOTE — Progress Notes (Signed)
New Patient Office Visit  Subjective    Patient ID: Annette White, female    DOB: 02/07/1996  Age: 27 y.o. MRN: [id number]  CC:  Chief Complaint  Patient presents with   Establish Care    New pt. Pt complains of lower back pain on both sides.     HPI Annette White presents to establish care   for complete physical and follow up of chronic conditions.  Back pain: 2 months it has been bothering her. States that she thought she had an STD and [facility]. State sthat she never heard back and recently had her period. States that she has discharge. States that  she was hospitalized. Thicker and yellow discharge.  State that the back pain is intermittent and feels like she can barely stand up. States it one side or the other. States that she has not tried OTC treatment.    Immunizations: -Tetanus: Completed in 2015 -Influenza: Out of season -Shingles: Too young  -Pneumonia: Too young  -HPV: up to date   Pap smear: 2023 that was normal at local health department. No abnormal paps per patient report     Outpatient Encounter Medications as of 01/04/2023  Medication Sig   [DISCONTINUED] fluticasone (FLONASE) 50 MCG/ACT nasal spray Place 1 spray into both nostrils 2 (two) times daily.   [DISCONTINUED] HYDROcodone-acetaminophen (NORCO/VICODIN) 5-325 MG tablet Take one tablet at night for pain; may take up to every 6 hours as needed for pain if not working or driving (Patient not taking: Reported on 01/04/2023)   [DISCONTINUED] ibuprofen (ADVIL) 800 MG tablet Take 1 tablet (800 mg total) by mouth every 8 (eight) hours as needed. (Patient not taking: Reported on 03/31/2022)   [DISCONTINUED] Norgestimate-Ethinyl Estradiol Triphasic (TRI-LO-SPRINTEC) 0.18/0.215/0.25 MG-25 MCG tab Take 1 tablet by mouth daily. (Patient not taking: Reported on 01/04/2023)   [DISCONTINUED] ondansetron (ZOFRAN) 4 MG tablet Take 1 tablet (4 mg total) by mouth every 8 (eight) hours as needed  for up to 12 doses for nausea or vomiting. (Patient not taking: Reported on 03/31/2022)   No facility-administered encounter medications on file as of 01/04/2023.    Past Medical History:  Diagnosis Date   No pertinent past medical history     Past Surgical History:  Procedure Laterality Date   Denies surgical hx      Family History  Problem Relation Age of Onset   Diabetes Mother    Hypertension Mother    Stroke Father    Hypertension Father    Diabetes Maternal Grandfather    Prostate cancer Maternal Grandfather    Cancer Paternal Grandmother        lung    Social History   Socioeconomic History   Marital status: Single    Spouse name: Not on file   Number of children: 4   Years of education: 11   Highest education level: 11th grade  Occupational History   Not on file  Tobacco Use   Smoking status: Every Day    Types: E-cigarettes, Cigars    Passive exposure: Current   Smokeless tobacco: Never  Vaping Use   Vaping status: Every Day   Substances: Flavoring  Substance and Sexual Activity   Alcohol use: Yes    Comment: rare   Drug use: Yes    Types: Marijuana    Comment: Last marijuana use 07/21/2021 am.   Sexual activity: Yes    Partners: Male    Birth control/protection: I.U.D.  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  Respiratory:  Negative  for shortness of breath.   Cardiovascular:  Negative for chest pain.  Gastrointestinal:  Positive for nausea. Negative for abdominal pain, constipation, diarrhea and vomiting.       Bm daily   Genitourinary:  Negative for dysuria and hematuria.       Vaginal discharge "+"  Musculoskeletal:  Positive for back pain.  Neurological:  Negative for headaches.  Psychiatric/Behavioral:  Negative for hallucinations and suicidal ideas.         Objective    BP 128/78   Pulse 99   Temp 98.7 F (37.1 C) (Temporal)   Ht 5\' 1"  (1.549 m)   Wt 173 lb (78.5 kg)   LMP 12/24/2022 (Exact Date)   SpO2 97%   BMI 32.69 kg/m   Physical Exam Vitals and nursing note reviewed. Exam conducted with a chaperone present Melina Copa, CMA).  Constitutional:      Appearance: Normal appearance.  HENT:     Right Ear: Tympanic membrane, ear canal and external ear normal.     Left Ear: Tympanic membrane, ear canal and external ear normal.     Mouth/Throat:     Mouth: Mucous membranes are moist.  Eyes:     Extraocular Movements: Extraocular movements intact.     Pupils: Pupils are equal, round, and reactive to light.  Cardiovascular:     Rate and Rhythm: Normal rate and regular rhythm.     Heart sounds: Normal heart sounds.  Pulmonary:     Effort: Pulmonary effort is normal.     Breath sounds: Normal breath sounds.  Abdominal:     General: Bowel sounds are normal. There is no distension.     Palpations: There is no mass.     Tenderness: There is no abdominal tenderness.     Hernia: No hernia is present.  Genitourinary:    Exam position: Lithotomy position.     Vagina: Vaginal discharge (thick and clear) present.     Cervix: Cervical motion tenderness and erythema present.     Uterus: Normal.      Adnexa: Right adnexa normal and left adnexa normal.  Musculoskeletal:     Lumbar back: Tenderness present. No bony tenderness. Negative right straight leg raise test and negative left straight leg raise  test.       Back:     Right lower leg: No edema.     Left lower leg: No edema.     Comments: Flexion and extension no pain Oblique turns - no pain  Lateral slide to the left caused discomfort   Lymphadenopathy:     Cervical: No cervical adenopathy.  Neurological:     General: No focal deficit present.  Mental Status: She is alert.     Deep Tendon Reflexes:     Reflex Scores:      Patellar reflexes are 2+ on the right side and 2+ on the left side.    Comments: Bilateral upper and lower extremity strength 5/5         Assessment & Plan:   Problem List Items Addressed This Visit       Other   Nausea    Ambiguous in nature.  Urinary pregnancy test negative      Abnormal urinalysis    Patient had trace Hgb and urine sent off for culture      Relevant Orders   Urine Culture   Vaginal discharge    Ambiguous in nature.  Patient states she was tested at the [facility] department but never heard back on results.  Will check patient today for gonorrhea, chlamydia, trichomonas, and a wet prep.  Patient was informed abstain from sex until test results available  Given patient's history of PID that required hospitalization and some cervical motion tenderness offered to treat patient with Rocephin IM along with Flagyl and doxycycline.  Patient currently declined.  States she does not do well with injections like to wait for the test to come back.  Gave her signs and symptoms when to be reevaluated      Relevant Orders   POCT urinalysis dipstick (Completed)   POCT urine pregnancy (Completed)   WET PREP BY MOLECULAR PROBE   C. trachomatis/N. gonorrhoeae RNA   Bilateral low back pain without sciatica - Primary    No injury do feel that this is musculoskeletal in nature.  Offered patient muscle relaxants but she would like to hold off for now.  Did give her some lumbar strain rehab exercises to do for patient not to do them if it causes additional pain      Relevant Orders    CBC   Comprehensive metabolic panel   POCT urine pregnancy (Completed)   Encounter to establish care    Patient to establish care with new healthcare provider       Return in about 1 year (around 01/04/2024) for CPE and Labs.   Audria Nine, NP

## 2023-01-04 NOTE — Assessment & Plan Note (Addendum)
Ambiguous in nature.  Patient states she was tested at the [facility] department but never heard back on results.  Will check patient today for gonorrhea, chlamydia, trichomonas, and a wet prep.  Patient was informed abstain from sex until test results available  Given patient's history of PID that required hospitalization and some cervical motion tenderness offered to treat patient with Rocephin IM along with Flagyl and doxycycline.  Patient currently declined.  States she does not do well with injections like to wait for the test to come back.  Gave her signs and symptoms when to be reevaluated

## 2023-01-04 NOTE — Assessment & Plan Note (Signed)
Ambiguous in nature.  Urinary pregnancy test negative

## 2023-01-04 NOTE — Assessment & Plan Note (Signed)
Patient had trace Hgb and urine sent off for culture

## 2023-01-04 NOTE — Patient Instructions (Signed)
Nice to see you today I will be int ouch with the labs once I have reviewed them Do not have sex until we have the results I think it is a muscle in your back  Follow up if you do not improve

## 2023-01-05 LAB — CBC
HCT: 38.2 % (ref 36.0–46.0)
Hemoglobin: 12.7 g/dL (ref 12.0–15.0)
MCHC: 33.2 g/dL (ref 30.0–36.0)
MCV: 89.6 fl (ref 78.0–100.0)
Platelets: 305 10*3/uL (ref 150.0–400.0)
RBC: 4.27 Mil/uL (ref 3.87–5.11)
RDW: 12.7 % (ref 11.5–15.5)
WBC: 8.8 10*3/uL (ref 4.0–10.5)

## 2023-01-05 LAB — COMPREHENSIVE METABOLIC PANEL
ALT: 13 U/L (ref 0–35)
AST: 16 U/L (ref 0–37)
Albumin: 4.4 g/dL (ref 3.5–5.2)
Alkaline Phosphatase: 47 U/L (ref 39–117)
BUN: 14 mg/dL (ref 6–23)
CO2: 28 mEq/L (ref 19–32)
Calcium: 9.3 mg/dL (ref 8.4–10.5)
Chloride: 106 mEq/L (ref 96–112)
Creatinine, Ser: 0.74 mg/dL (ref 0.40–1.20)
GFR: 109.66 mL/min (ref >60.00)
Glucose, Bld: 97 mg/dL (ref 70–99)
Potassium: 4 mEq/L (ref 3.5–5.1)
Sodium: 137 mEq/L (ref 135–145)
Total Bilirubin: 0.6 mg/dL (ref 0.2–1.2)
Total Protein: 7.3 g/dL (ref 6.0–8.3)

## 2023-01-05 LAB — WET PREP BY MOLECULAR PROBE
Candida species: NOT DETECTED
MICRO NUMBER:: 15200372
SPECIMEN QUALITY:: ADEQUATE
Trichomonas vaginosis: NOT DETECTED

## 2023-01-05 LAB — URINE CULTURE
MICRO NUMBER:: 15200373
Result:: NO GROWTH
SPECIMEN QUALITY:: ADEQUATE

## 2023-01-05 LAB — C. TRACHOMATIS/N. GONORRHOEAE RNA
C. trachomatis RNA, TMA: DETECTED — AB
N. gonorrhoeae RNA, TMA: NOT DETECTED

## 2023-01-06 ENCOUNTER — Other Ambulatory Visit: Payer: Self-pay | Admitting: Nurse Practitioner

## 2023-01-06 DIAGNOSIS — B9689 Other specified bacterial agents as the cause of diseases classified elsewhere: Secondary | ICD-10-CM

## 2023-01-06 DIAGNOSIS — A749 Chlamydial infection, unspecified: Secondary | ICD-10-CM

## 2023-01-06 MED ORDER — DOXYCYCLINE HYCLATE 100 MG PO TABS
100.0000 mg | ORAL_TABLET | Freq: Two times a day (BID) | ORAL | 0 refills | Status: AC
Start: 2023-01-06 — End: 2023-01-13

## 2023-01-06 MED ORDER — METRONIDAZOLE 500 MG PO TABS
500.0000 mg | ORAL_TABLET | Freq: Two times a day (BID) | ORAL | 0 refills | Status: DC
Start: 2023-01-06 — End: 2023-09-26

## 2023-05-10 ENCOUNTER — Emergency Department
Admission: EM | Admit: 2023-05-10 | Discharge: 2023-05-10 | Disposition: A | Discharge status: Home / Self Care | Payer: 59 | Attending: Emergency Medicine | Admitting: Emergency Medicine

## 2023-05-10 ENCOUNTER — Other Ambulatory Visit: Payer: Self-pay

## 2023-05-10 ENCOUNTER — Emergency Department: Payer: 59

## 2023-05-10 ENCOUNTER — Encounter: Payer: Self-pay | Admitting: Emergency Medicine

## 2023-05-10 DIAGNOSIS — R102 Pelvic and perineal pain: Secondary | ICD-10-CM | POA: Diagnosis not present

## 2023-05-10 DIAGNOSIS — M545 Low back pain, unspecified: Secondary | ICD-10-CM | POA: Insufficient documentation

## 2023-05-10 DIAGNOSIS — R1030 Lower abdominal pain, unspecified: Secondary | ICD-10-CM | POA: Diagnosis not present

## 2023-05-10 DIAGNOSIS — N898 Other specified noninflammatory disorders of vagina: Secondary | ICD-10-CM | POA: Insufficient documentation

## 2023-05-10 LAB — COMPREHENSIVE METABOLIC PANEL
ALT: 16 U/L (ref 0–44)
AST: 21 U/L (ref 15–41)
Albumin: 4.3 g/dL (ref 3.5–5.0)
Alkaline Phosphatase: 46 U/L (ref 38–126)
Anion gap: 6 (ref 5–15)
BUN: 10 mg/dL (ref 6–20)
CO2: 26 mmol/L (ref 22–32)
Calcium: 8.9 mg/dL (ref 8.9–10.3)
Chloride: 107 mmol/L (ref 98–111)
Creatinine, Ser: 0.79 mg/dL (ref 0.44–1.00)
GFR, Estimated: >60 mL/min (ref >60)
Glucose, Bld: 110 mg/dL — ABNORMAL HIGH (ref 70–99)
Potassium: 3.1 mmol/L — ABNORMAL LOW (ref 3.5–5.1)
Sodium: 139 mmol/L (ref 135–145)
Total Bilirubin: 0.8 mg/dL (ref <1.2)
Total Protein: 7.2 g/dL (ref 6.5–8.1)

## 2023-05-10 LAB — URINALYSIS, ROUTINE W REFLEX MICROSCOPIC
Bilirubin Urine: NEGATIVE
Glucose, UA: NEGATIVE mg/dL
Hgb urine dipstick: NEGATIVE
Ketones, ur: NEGATIVE mg/dL
Leukocytes,Ua: NEGATIVE
Nitrite: NEGATIVE
Protein, ur: NEGATIVE mg/dL
Specific Gravity, Urine: 1.016 (ref 1.005–1.030)
pH: 5 (ref 5.0–8.0)

## 2023-05-10 LAB — WET PREP, GENITAL
Clue Cells Wet Prep HPF POC: NONE SEEN
Sperm: NONE SEEN
Trich, Wet Prep: NONE SEEN
WBC, Wet Prep HPF POC: <10 (ref <10)
Yeast Wet Prep HPF POC: NONE SEEN

## 2023-05-10 LAB — CBC
HCT: 36.8 % (ref 36.0–46.0)
Hemoglobin: 12.3 g/dL (ref 12.0–15.0)
MCH: 29.6 pg (ref 26.0–34.0)
MCHC: 33.4 g/dL (ref 30.0–36.0)
MCV: 88.5 fL (ref 80.0–100.0)
Platelets: 304 10*3/uL (ref 150–400)
RBC: 4.16 MIL/uL (ref 3.87–5.11)
RDW: 12.2 % (ref 11.5–15.5)
WBC: 8.4 10*3/uL (ref 4.0–10.5)
nRBC: 0 % (ref 0.0–0.2)

## 2023-05-10 LAB — CHLAMYDIA/NGC RT PCR (ARMC ONLY)
Chlamydia Tr: NOT DETECTED
N gonorrhoeae: NOT DETECTED

## 2023-05-10 LAB — POC URINE PREG, ED: Preg Test, Ur: NEGATIVE

## 2023-05-10 LAB — LIPASE, BLOOD: Lipase: 28 U/L (ref 11–51)

## 2023-05-10 MED ORDER — LIDOCAINE HCL (PF) 1 % IJ SOLN
5.0000 mL | Freq: Once | INTRAMUSCULAR | Status: AC
  Administered 2023-05-10: 5 mL
  Filled 2023-05-10: qty 5

## 2023-05-10 MED ORDER — CEFTRIAXONE SODIUM 250 MG IJ SOLR
250.0000 mg | Freq: Once | INTRAMUSCULAR | Status: AC
  Administered 2023-05-10: 250 mg via INTRAMUSCULAR
  Filled 2023-05-10: qty 250

## 2023-05-10 MED ORDER — DOXYCYCLINE HYCLATE 100 MG PO TABS
100.0000 mg | ORAL_TABLET | Freq: Once | ORAL | Status: AC
  Administered 2023-05-10: 100 mg via ORAL
  Filled 2023-05-10: qty 1

## 2023-05-10 MED ORDER — PANTOPRAZOLE SODIUM 40 MG PO TBEC: 40.0000 mg | DELAYED_RELEASE_TABLET | Freq: Every day | ORAL | 1 refills | Status: DC

## 2023-05-10 MED ORDER — DOXYCYCLINE HYCLATE 100 MG PO TABS: 100.0000 mg | ORAL_TABLET | Freq: Two times a day (BID) | ORAL | 0 refills | Status: DC

## 2023-05-10 NOTE — ED Triage Notes (Signed)
Pt ambulatory to triage for lower back pain (going on for 1 month), pelvic pain and N/V (2-3 weeks).

## 2023-05-10 NOTE — ED Provider Notes (Signed)
[facility] Provider Note    Event Date/Time   First MD Initiated Contact with Patient 05/10/23 1503     (approximate)  History   Chief Complaint: Back Pain and Abdominal Pain  HPI  Annette White is a 27 y.o. female with no significant past medical history presents to the emergency department for lower back pain and abdominal pain.  According to the patient for the past month or so she has intermittently been experiencing some lower abdominal and lower back pain.  Denies any pain worse with movement.  Denies any urinary symptoms.  Patient states she has had a mild amount of discharge.  No fever.  No nausea or vomiting.  Also complaining of approximately 1 month of some mild upper abdominal discomfort and nausea.  Physical Exam   Triage Vital Signs: ED Triage Vitals  Encounter Vitals Group     BP 05/10/23 1420 128/77     Systolic BP Percentile --      Diastolic BP Percentile --      Pulse Rate 05/10/23 1420 99     Resp 05/10/23 1420 18     Temp 05/10/23 1419 98.4 F (36.9 C)     Temp Source 05/10/23 1419 Oral     SpO2 05/10/23 1420 98 %     Weight 05/10/23 1419 168 lb (76.2 kg)     Height 05/10/23 1419 5\' 1"  (1.549 m)     Head Circumference --      Peak Flow --      Pain Score 05/10/23 1419 7     Pain Loc --      Pain Education --      Exclude from Growth Chart --     Most recent vital signs: Vitals:   05/10/23 1419 05/10/23 1420  BP:  128/77  Pulse:  99  Resp:  18  Temp: 98.4 F (36.9 C)   SpO2:  98%    General: Awake, no distress.  CV:  Good peripheral perfusion.  Regular rate and rhythm  Resp:  Normal effort.  Equal breath sounds bilaterally.  Abd:  No distention.  Soft, nontender.  No rebound or guarding.  ED Results / Procedures / Treatments    RADIOLOGY  Patient's ultrasound is negative for acute abnormality.   MEDICATIONS ORDERED IN ED: Medications - No data to display   IMPRESSION / MDM / ASSESSMENT AND PLAN / ED  COURSE  I reviewed the triage vital signs and the nursing notes.  Patient's presentation is most consistent with acute presentation with potential threat to life or bodily function.  Patient presents to the emergency department for lower back discomfort and vaginal discharge over the past 1 month or so.  Denies any significant pain denies any fever.  Does not believe she is at risk for STI however states she has had an STI before with vaginal discharge and would like to be treated as a precaution.  We will send a wet prep as well as a GC/chlamydia.  Patient's workup otherwise is reassuring with a normal CBC normal chemistry negative pregnancy test normal lipase and a reassuring urinalysis.  Given the patient's complaint of some lower back and abdominal discomfort at times we will obtain a pelvic ultrasound as a precaution.  We will cover with Rocephin and doxycycline as a precaution.  Ultrasound is negative for acute abnormality.  Patient's wet prep is negative gonorrhea/chlamydia negative.  Will discharge with PCP follow-up.  Patient agreeable to plan.  FINAL  CLINICAL IMPRESSION(S) / ED DIAGNOSES   Vaginal discharge Back pain  Rx / DC Orders   Protonix Doxycycline  Note:  This document was prepared using Dragon voice recognition software and may include unintentional dictation errors.   Minna Antis, MD 05/10/23 1954

## 2023-05-13 ENCOUNTER — Ambulatory Visit
Admission: RE | Admit: 2023-05-13 | Discharge: 2023-05-13 | Disposition: A | Discharge status: Home / Self Care | Payer: 59 | Source: Ambulatory Visit | Attending: Nurse Practitioner | Admitting: Nurse Practitioner

## 2023-05-13 ENCOUNTER — Ambulatory Visit (INDEPENDENT_AMBULATORY_CARE_PROVIDER_SITE_OTHER): Payer: 59 | Admitting: Nurse Practitioner

## 2023-05-13 ENCOUNTER — Encounter: Payer: Self-pay | Admitting: Nurse Practitioner

## 2023-05-13 VITALS — BP 108/76 | HR 105 | Temp 98.1°F | Ht 61.0 in | Wt 170.0 lb

## 2023-05-13 DIAGNOSIS — N898 Other specified noninflammatory disorders of vagina: Secondary | ICD-10-CM | POA: Diagnosis not present

## 2023-05-13 DIAGNOSIS — G8929 Other chronic pain: Secondary | ICD-10-CM | POA: Diagnosis not present

## 2023-05-13 DIAGNOSIS — M545 Low back pain, unspecified: Secondary | ICD-10-CM

## 2023-05-13 LAB — POCT URINE PREGNANCY: Preg Test, Ur: NEGATIVE

## 2023-05-13 LAB — POC URINALSYSI DIPSTICK (AUTOMATED)
Bilirubin, UA: NEGATIVE
Blood, UA: NEGATIVE
Glucose, UA: NEGATIVE
Ketones, UA: NEGATIVE
Leukocytes, UA: NEGATIVE
Nitrite, UA: NEGATIVE
Protein, UA: NEGATIVE
Spec Grav, UA: <=1.005 — AB (ref 1.010–1.025)
Urobilinogen, UA: 0.2 U/dL
pH, UA: 6.5 (ref 5.0–8.0)

## 2023-05-13 MED ORDER — METHOCARBAMOL 500 MG PO TABS: 500.0000 mg | ORAL_TABLET | Freq: Three times a day (TID) | ORAL | 0 refills | Status: DC | PRN

## 2023-05-13 NOTE — Assessment & Plan Note (Signed)
History of the same.  Will do lumbar picture as it has been going on for 3 months.  Methocarbamol 500 mg 3 times daily as needed sedation precautions reviewed if x-ray is normal and muscle relaxer does not help consider physical therapy at this juncture.  UA was negative along with urine pregnancy test was negative

## 2023-05-13 NOTE — Assessment & Plan Note (Signed)
History of the same.  Patient was recently seen in the emergency department and treated with an injectable medication doxycycline that she is still taking.  Gonorrhea chlamydia and wet prep negative repeat wet prep today

## 2023-05-13 NOTE — Patient Instructions (Addendum)
Nice to see you today We will get an xray and try a muscle relaxer. They can cause sedation so use caution. Follow up if you do not improve  6 months for physical

## 2023-05-13 NOTE — Progress Notes (Signed)
Acute Office Visit  Subjective:     Patient ID: Annette White, female    DOB: December 23, 1995, 27 y.o.   MRN: [id number]  Chief Complaint  Patient presents with   Back Pain    Pt complains of lower back pain for 3 months. Sharp, dull and deep pains. Pain level 6. Has used OTC tylenol but very little help. Pt states recently she has been nausesas and unable to eat.      Patient is in today for back pain with a history of that is non contributory   Back pain for about for 3 months and no injury. States that it is intermittne in nature. Sharp and dull depends on the day. States that laying down makes it worse. States that she did try tylenol and ibuprofen. States that she was seen in the ED for back pian and admonimal pain treate empircally for STD all though test came back negative. She is still taking the doxycycline. States that she is having some discharge without smell. States that if she sees it itis yellow  States that she has had decrease appetite for 5 days. State that she is able to drink fluids   Review of Systems  Constitutional:  Negative for chills and fever.  Respiratory:  Negative for shortness of breath.   Cardiovascular:  Negative for chest pain.  Gastrointestinal:  Negative for abdominal pain, constipation and diarrhea.  Genitourinary:  Negative for dysuria and hematuria.  Musculoskeletal:  Positive for back pain.  Neurological:  Negative for tingling, weakness and headaches.        Objective:    BP 108/76   Pulse (!) 105   Temp 98.1 F (36.7 C) (Oral)   Ht 5\' 1"  (1.549 m)   Wt 170 lb (77.1 kg)   LMP 04/27/2023 (Exact Date) Comment: Pt states no chance of pregnancy.  SpO2 98%   BMI 32.12 kg/m  BP Readings from Last 3 Encounters:  05/13/23 108/76  05/10/23 119/87  01/04/23 128/78   Wt Readings from Last 3 Encounters:  05/13/23 170 lb (77.1 kg)  05/10/23 168 lb (76.2 kg)  01/04/23 173 lb (78.5 kg)   SpO2 Readings from Last 3 Encounters:   05/13/23 98%  05/10/23 99%  01/04/23 97%      Physical Exam Vitals and nursing note reviewed.  Constitutional:      Appearance: Normal appearance.  Cardiovascular:     Rate and Rhythm: Normal rate and regular rhythm.     Heart sounds: Normal heart sounds.  Pulmonary:     Effort: Pulmonary effort is normal.     Breath sounds: Normal breath sounds.  Abdominal:     General: Bowel sounds are normal. There is no distension.     Palpations: There is no mass.     Tenderness: There is no abdominal tenderness. There is no right CVA tenderness or left CVA tenderness.     Hernia: No hernia is present.  Musculoskeletal:     Lumbar back: Tenderness present. No bony tenderness. Negative right straight leg raise test and negative left straight leg raise test.       Back:     Comments: Patient has increased discomfort with back extension No increased discomfort with lumbar flexion, lateral slides or oblique turns.  Neurological:     General: No focal deficit present.     Mental Status: She is alert.     Deep Tendon Reflexes:     Reflex Scores:  Patellar reflexes are 2+ on the right side and 2+ on the left side.    Comments: Bilateral lower extremity strength 5/5     Results for orders placed or performed in visit on 05/13/23  POCT Urinalysis Dipstick (Automated)  Result Value Ref Range   Color, UA light yellow    Clarity, UA clear    Glucose, UA Negative Negative   Bilirubin, UA neg    Ketones, UA neg    Spec Grav, UA <=1.005 (A) 1.010 - 1.025   Blood, UA neg    pH, UA 6.5 5.0 - 8.0   Protein, UA Negative Negative   Urobilinogen, UA 0.2 0.2 or 1.0 E.U./dL   Nitrite, UA neg    Leukocytes, UA Negative Negative  POCT urine pregnancy  Result Value Ref Range   Preg Test, Ur Negative Negative        Assessment & Plan:   Problem List Items Addressed This Visit       Other   Vaginal discharge    History of the same.  Patient was recently seen in the emergency  department and treated with an injectable medication doxycycline that she is still taking.  Gonorrhea chlamydia and wet prep negative repeat wet prep today      Relevant Orders   WET PREP BY MOLECULAR PROBE   Bilateral low back pain without sciatica - Primary    History of the same.  Will do lumbar picture as it has been going on for 3 months.  Methocarbamol 500 mg 3 times daily as needed sedation precautions reviewed if x-ray is normal and muscle relaxer does not help consider physical therapy at this juncture.  UA was negative along with urine pregnancy test was negative      Relevant Medications   methocarbamol (ROBAXIN) 500 MG tablet   Other Relevant Orders   POCT Urinalysis Dipstick (Automated) (Completed)   POCT urine pregnancy (Completed)   DG Lumbar Spine Complete    Meds ordered this encounter  Medications   methocarbamol (ROBAXIN) 500 MG tablet    Sig: Take 1 tablet (500 mg total) by mouth every 8 (eight) hours as needed for muscle spasms.    Dispense:  30 tablet    Refill:  0    Order Specific Question:   Supervising Provider    Answer:   Roxy Manns A [[id number]]    Return in about 6 months (around 11/10/2023), or if symptoms worsen or fail to improve, for CPE and Labs.  Audria Nine, NP

## 2023-05-14 LAB — WET PREP BY MOLECULAR PROBE
Candida species: NOT DETECTED
Gardnerella vaginalis: NOT DETECTED
MICRO NUMBER:: 15763020
SPECIMEN QUALITY:: ADEQUATE
Trichomonas vaginosis: NOT DETECTED

## 2023-07-25 ENCOUNTER — Encounter (INDEPENDENT_AMBULATORY_CARE_PROVIDER_SITE_OTHER): Payer: Self-pay

## 2023-09-21 ENCOUNTER — Ambulatory Visit: Payer: Self-pay | Admitting: Nurse Practitioner

## 2023-09-21 DIAGNOSIS — Z113 Encounter for screening for infections with a predominantly sexual mode of transmission: Secondary | ICD-10-CM

## 2023-09-21 DIAGNOSIS — Z3201 Encounter for pregnancy test, result positive: Secondary | ICD-10-CM

## 2023-09-21 DIAGNOSIS — N912 Amenorrhea, unspecified: Secondary | ICD-10-CM

## 2023-09-21 LAB — HEPATITIS B SURFACE ANTIGEN

## 2023-09-21 LAB — WET PREP FOR TRICH, YEAST, CLUE
Trichomonas Exam: NEGATIVE
Yeast Exam: NEGATIVE

## 2023-09-21 LAB — HM HIV SCREENING LAB: HM HIV Screening: NEGATIVE

## 2023-09-21 LAB — HM HEPATITIS C SCREENING LAB: HM Hepatitis Screen: NEGATIVE

## 2023-09-21 LAB — PREGNANCY, URINE: Preg Test, Ur: POSITIVE — AB

## 2023-09-21 NOTE — Progress Notes (Signed)
 Pt here for STI screening.  Is having symptoms including right lower back pain.  Menses have been irregular since February.  Urine pregnancy test today is positive.  Confirmation of pregnancy and pregnancy packet provided to patient by K. Oppong, RN.-Collins Scotland, RN

## 2023-09-26 ENCOUNTER — Encounter: Payer: Self-pay | Admitting: Nurse Practitioner

## 2023-09-26 DIAGNOSIS — Z3201 Encounter for pregnancy test, result positive: Secondary | ICD-10-CM | POA: Insufficient documentation

## 2023-09-26 NOTE — Progress Notes (Signed)
 [facility] Department STI clinic [address] [place] [address] Main phone: [phone]  STI screening visit  Subjective:  Annette White is a 28 y.o. female being seen today for an STI screening visit. The patient reports they do have symptoms.  Patient reports that they do not desire a pregnancy in the next year.   They reported they are not interested in discussing contraception today.    Patient's last menstrual period was 08/08/2023.  Patient has the following medical conditions:  Patient Active Problem List   Diagnosis Date Noted   Positive pregnancy test 09/21/23 09/26/2023   Abnormal urinalysis 01/04/2023   Vaginal discharge 01/04/2023   Bilateral low back pain without sciatica 01/04/2023   Encounter to establish care 01/04/2023   Trichomonas infection 03/31/22 03/31/2022   Left pyosalpinx 12/19/2021   Obesity BMI 30.4 07/21/2021    history of STDs (trich, Chlamydia, GC, PID) 07/21/2021   Smoker 07/21/2021   Vapes nicotine containing substance 07/21/2021   Nausea 01/23/2018   Eczema 07/17/2014   Allergic rhinitis due to allergen 07/17/2014    Chief Complaint  Patient presents with   SEXUALLY TRANSMITTED DISEASE    STI screening-vaginal discharge-no color or odor-lower back pain on right side.   Patient is a pleasant 28 y.o. female who presents to the office today requesting symptomatic STI testing.  Her symptoms include pain with sex, increased vaginal discharge that is white, thick, without odor, nausea and vomiting with diarrhea all within the last 3 weeks. Also, low right sided back pain like cramping that began with last period.  Patient indicates 2 female partners in the last 2 months. She reports practicing vaginal sex and uses condoms sometimes. Patient indicates STI history including Chlamydia, gonorrhea, and trich. Patient reports last sex was 09/15/23. She indicates no use of contraception method.  Patient indicates  LMP was 08/08/23 and was unusually short only lasting 3 days and is now 16 days late.   Last HIV test per patient/review of record was  Lab Results  Component Value Date   HMHIVSCREEN Negative - Validated 07/21/2021    Lab Results  Component Value Date   HIV NON REACTIVE 02/25/2015   Last HEPC test per patient/review of record was  Lab Results  Component Value Date   HMHEPCSCREEN Negative-Validated 07/21/2021    Last HEPB test per patient/review of record was No components found for: "HMHEPBSCREEN"   Patient reports last pap was:    Lab Results  Component Value Date   SPECADGYN Comment 07/21/2021   Result Date Procedure Results Follow-ups  07/21/2021 IGP, rfx Aptima HPV ASCU DIAGNOSIS:: Comment Specimen adequacy:: Comment Clinician Provided ICD10: Comment Performed by:: Comment PAP Smear Comment: . Note:: Comment Test Methodology: Comment PAP Reflex: Comment     Screening for MPX risk: Does the patient have an unexplained rash? No Is the patient MSM? No Does the patient endorse multiple sex partners or anonymous sex partners? Yes Did the patient have close or sexual contact with a person diagnosed with MPX? No Has the patient traveled outside the [place] where MPX is endemic? No Is there a high clinical suspicion for MPX-- evidenced by one of the following No  -Unlikely to be chickenpox  -Lymphadenopathy  -Rash that present in same phase of evolution on any given body part See flowsheet for further details and programmatic requirements.   Immunization history:  Immunization History  Administered Date(s) Administered   HPV Quadrivalent 01/05/2007, 03/10/2007, 08/08/2007   Hepatitis A  08/08/2007   Hepatitis B 07/27/1994   MMR 01/09/1999   Tdap 07/14/2013   Varicella 08/08/2007     The following portions of the patient's history were reviewed and updated as appropriate: allergies, current medications, past medical history, past social history, past surgical history  and problem list.  Objective:  There were no vitals filed for this visit.  Physical Exam Nursing note reviewed. Exam conducted with a chaperone present.  Constitutional:      Appearance: Normal appearance.  HENT:     Head: Normocephalic.     Salivary Glands: Right salivary gland is not diffusely enlarged or tender. Left salivary gland is not diffusely enlarged or tender.     Mouth/Throat:     Lips: Pink. No lesions.     Mouth: Mucous membranes are moist.     Tongue: No lesions. Tongue does not deviate from midline.     Pharynx: Oropharynx is clear. Uvula midline.     Tonsils: No tonsillar exudate.  Eyes:     General:        Right eye: No discharge.        Left eye: No discharge.  Pulmonary:     Effort: Pulmonary effort is normal.  Genitourinary:    General: Normal vulva.     Exam position: Lithotomy position.     Pubic Area: No rash or pubic lice.      Tanner stage (genital): 5.     Labia:        Right: No rash, tenderness, lesion or injury.        Left: No rash, tenderness, lesion or injury.      Vagina: Vaginal discharge present. No erythema, tenderness, bleeding or lesions.     Cervix: Normal. No cervical motion tenderness, discharge, friability, lesion, erythema, cervical bleeding or eversion.     Uterus: Normal.      Adnexa: Right adnexa normal and left adnexa normal.     Comments: pH<4.5 White vaginal discharge present in vaginal canal. No odor, thick, no adherent.  Lymphadenopathy:     Head:     Right side of head: No submental, submandibular, tonsillar, preauricular or posterior auricular adenopathy.     Left side of head: No submental, submandibular, tonsillar, preauricular or posterior auricular adenopathy.     Cervical: No cervical adenopathy.     Right cervical: No superficial or posterior cervical adenopathy.    Left cervical: No superficial or posterior cervical adenopathy.     Upper Body:     Right upper body: No supraclavicular or axillary adenopathy.      Left upper body: No supraclavicular or axillary adenopathy.     Lower Body: No right inguinal adenopathy. No left inguinal adenopathy.  Skin:    General: Skin is warm and dry.     Findings: No rash.     Comments: Skin tone appropriate for ethnicity.   Neurological:     Mental Status: She is alert and oriented to person, place, and time.  Psychiatric:        Attention and Perception: Attention and perception normal.        Mood and Affect: Mood and affect normal.        Speech: Speech normal.        Behavior: Behavior normal. Behavior is cooperative.        Thought Content: Thought content normal.     Assessment and Plan:  ATHALIA SETTERLUND is a 28 y.o. female presenting to the [facility] Department  for STI screening  1. Screening for venereal disease (Primary)  - Chlamydia/Gonorrhea [facility] - HBV Antigen/Antibody [facility] - HIV/HCV [facility] - Syphilis Serology, [facility] - WET PREP FOR TRICH, YEAST, CLUE  2. Amenorrhea UPT positive in office today.  - Pregnancy, urine  3. Positive pregnancy test 09/21/23 UPT positive in office today.  Patient given PNV per SO.  Discussion with patient about her concern for possibility of tubal pregnancy. Patient indicates she has been told in the past she could not get pregnant due to scarring of fallopian tubes with STI infection requiring removal of IUD twice. Advised patient she should report to ED for [place] to rule this out. Patient advised we do not have the ability to perform [place] in this office and her pregnancy is too early to detect FHT on handheld doppler. Patient verbalized understanding and agreed with plan.   Patient accepted screenings including vaginal CT/GC and bloodwork for HIV/RPR, and wet prep. Patient meets criteria for HepB screening? Yes. Ordered? yes Patient meets criteria for HepC screening? Yes. Ordered? yes  Treat wet prep per standing order Discussed time line for State Lab results and that  patient will be called with positive results and encouraged patient to call if she had not heard in 2 weeks.  Counseled to return or seek care for continued or worsening symptoms Recommended repeat testing in 3 months with positive results. Recommended condom use with all sex for STI prevention.   Patient is currently using  no method  to prevent pregnancy.    Return if symptoms worsen or fail to improve.  No future appointments.  Total time with patient 30 minutes.   Edmonia James, NP

## 2023-10-12 ENCOUNTER — Telehealth: Admitting: Physician Assistant

## 2023-10-12 DIAGNOSIS — O23599 Infection of other part of genital tract in pregnancy, unspecified trimester: Secondary | ICD-10-CM

## 2023-10-12 NOTE — Progress Notes (Signed)
  Because of current pregnancy and need for exam and testing to determine what is truly going on and so proper treatment can be given, I feel your condition warrants further evaluation and I recommend that you be seen in a face-to-face visit. I first recommend contacting your PCP and/or OBGYN   NOTE: There will be NO CHARGE for this E-Visit   If you are having a true medical emergency, please call 911.     For an urgent face to face visit, Annette White has multiple urgent care centers for your convenience.  Click the link below for the full list of locations and hours, walk-in wait times, appointment scheduling options and driving directions:  Urgent Care - [place], [place], [place], [place], [place], [place]  [place]     Your MyChart E-visit questionnaire answers were reviewed by a board certified advanced clinical practitioner to complete your personal care plan based on your specific symptoms.    Thank you for using e-Visits.

## 2023-10-13 ENCOUNTER — Emergency Department

## 2023-10-13 ENCOUNTER — Emergency Department
Admission: EM | Admit: 2023-10-13 | Discharge: 2023-10-13 | Disposition: A | Discharge status: Home / Self Care | Attending: Emergency Medicine | Admitting: Emergency Medicine

## 2023-10-13 DIAGNOSIS — N898 Other specified noninflammatory disorders of vagina: Secondary | ICD-10-CM | POA: Diagnosis not present

## 2023-10-13 DIAGNOSIS — O26891 Other specified pregnancy related conditions, first trimester: Secondary | ICD-10-CM | POA: Diagnosis present

## 2023-10-13 DIAGNOSIS — Z3A01 Less than 8 weeks gestation of pregnancy: Secondary | ICD-10-CM | POA: Insufficient documentation

## 2023-10-13 DIAGNOSIS — Z3491 Encounter for supervision of normal pregnancy, unspecified, first trimester: Secondary | ICD-10-CM

## 2023-10-13 LAB — CBC WITH DIFFERENTIAL/PLATELET
Abs Immature Granulocytes: 0.02 10*3/uL (ref 0.00–0.07)
Basophils Absolute: 0.1 10*3/uL (ref 0.0–0.1)
Basophils Relative: 1 %
Eosinophils Absolute: 0.2 10*3/uL (ref 0.0–0.5)
Eosinophils Relative: 2 %
HCT: 37 % (ref 36.0–46.0)
Hemoglobin: 12.5 g/dL (ref 12.0–15.0)
Immature Granulocytes: 0 %
Lymphocytes Relative: 22 %
Lymphs Abs: 2.3 10*3/uL (ref 0.7–4.0)
MCH: 30.2 pg (ref 26.0–34.0)
MCHC: 33.8 g/dL (ref 30.0–36.0)
MCV: 89.4 fL (ref 80.0–100.0)
Monocytes Absolute: 0.6 10*3/uL (ref 0.1–1.0)
Monocytes Relative: 6 %
Neutro Abs: 7 10*3/uL (ref 1.7–7.7)
Neutrophils Relative %: 69 %
Platelets: 308 10*3/uL (ref 150–400)
RBC: 4.14 MIL/uL (ref 3.87–5.11)
RDW: 12.4 % (ref 11.5–15.5)
WBC: 10.2 10*3/uL (ref 4.0–10.5)
nRBC: 0 % (ref 0.0–0.2)

## 2023-10-13 LAB — COMPREHENSIVE METABOLIC PANEL WITH GFR
ALT: 16 U/L (ref 0–44)
AST: 17 U/L (ref 15–41)
Albumin: 3.9 g/dL (ref 3.5–5.0)
Alkaline Phosphatase: 46 U/L (ref 38–126)
Anion gap: 6 (ref 5–15)
BUN: 10 mg/dL (ref 6–20)
CO2: 24 mmol/L (ref 22–32)
Calcium: 9.1 mg/dL (ref 8.9–10.3)
Chloride: 104 mmol/L (ref 98–111)
Creatinine, Ser: 0.64 mg/dL (ref 0.44–1.00)
GFR, Estimated: >60 mL/min (ref >60)
Glucose, Bld: 96 mg/dL (ref 70–99)
Potassium: 3.6 mmol/L (ref 3.5–5.1)
Sodium: 134 mmol/L — ABNORMAL LOW (ref 135–145)
Total Bilirubin: 0.3 mg/dL (ref 0.0–1.2)
Total Protein: 7 g/dL (ref 6.5–8.1)

## 2023-10-13 LAB — CHLAMYDIA/NGC RT PCR (ARMC ONLY)
Chlamydia Tr: NOT DETECTED
N gonorrhoeae: NOT DETECTED

## 2023-10-13 LAB — URINALYSIS, ROUTINE W REFLEX MICROSCOPIC
Bilirubin Urine: NEGATIVE
Glucose, UA: NEGATIVE mg/dL
Hgb urine dipstick: NEGATIVE
Ketones, ur: NEGATIVE mg/dL
Leukocytes,Ua: NEGATIVE
Nitrite: NEGATIVE
Protein, ur: NEGATIVE mg/dL
Specific Gravity, Urine: 1.019 (ref 1.005–1.030)
pH: 8 (ref 5.0–8.0)

## 2023-10-13 LAB — WET PREP, GENITAL
Clue Cells Wet Prep HPF POC: NONE SEEN
Sperm: NONE SEEN
Trich, Wet Prep: NONE SEEN
WBC, Wet Prep HPF POC: >=10 — AB (ref <10)
Yeast Wet Prep HPF POC: NONE SEEN

## 2023-10-13 LAB — POC URINE PREG, ED: Preg Test, Ur: POSITIVE — AB

## 2023-10-13 LAB — ABO/RH: ABO/RH(D): O POS

## 2023-10-13 LAB — HCG, QUANTITATIVE, PREGNANCY: hCG, Beta Chain, Quant, S: 73237 m[IU]/mL — ABNORMAL HIGH (ref <5)

## 2023-10-13 MED ORDER — METRONIDAZOLE 500 MG PO TABS: 500.0000 mg | ORAL_TABLET | Freq: Two times a day (BID) | ORAL | 0 refills | Status: AC

## 2023-10-13 NOTE — ED Triage Notes (Signed)
 Pt presents to the ED POV from home for lower back pain (3 days), bilateral lower abdominal cramping (x3 days), and yellow vaginal discharge (since 09/27/22). Pt states that she was seen at the health department on 4/7 and had std testing, but everything was negative. Pt reports being sexually active one time since. Pt states that she was thinking it could be an STD. Pt is [redacted] weeks pregnant. Has not had an OB appointment yet. No emesis or diarrhea. Denies vaginal bleeding.

## 2023-10-13 NOTE — ED Provider Notes (Signed)
 [facility] Provider Note    Event Date/Time   First MD Initiated Contact with Patient 10/13/23 1231     (approximate)   History   Abdominal Pain and Back Pain   HPI  Annette White is a 28 y.o. female G5, P4 presents emergency department with complaints of abdominal pain, pelvic pain, vaginal discharge.  Patient was checked for STDs few weeks ago at the health department and they were negative.  Continues to have a yellowish discharge.  Cramping and abdominal pain above the pelvis, states that she had never had a pregnancy to have this type of pain before.  No bleeding.      Physical Exam   Triage Vital Signs: ED Triage Vitals  Encounter Vitals Group     BP 10/13/23 1203 (!) 134/91     Systolic BP Percentile --      Diastolic BP Percentile --      Pulse Rate 10/13/23 1203 (!) 112     Resp 10/13/23 1203 18     Temp 10/13/23 1203 98.9 F (37.2 C)     Temp Source 10/13/23 1203 Oral     SpO2 10/13/23 1203 100 %     Weight 10/13/23 1205 170 lb (77.1 kg)     Height 10/13/23 1205 5\' 1"  (1.549 m)     Head Circumference --      Peak Flow --      Pain Score 10/13/23 1205 4     Pain Loc --      Pain Education --      Exclude from Growth Chart --     Most recent vital signs: Vitals:   10/13/23 1203  BP: (!) 134/91  Pulse: (!) 112  Resp: 18  Temp: 98.9 F (37.2 C)  SpO2: 100%     General: Awake, no distress.   CV:  Good peripheral perfusion. regular rate and  rhythm Resp:  Normal effort.  Abd:  No distention.  Tender across pubic area, tender in the right and left lower quadrant Other:      ED Results / Procedures / Treatments   Labs (all labs ordered are listed, but only abnormal results are displayed) Labs Reviewed  WET PREP, GENITAL - Abnormal; Notable for the following components:      Result Value   WBC, Wet Prep HPF POC >=10 (*)    All other components within normal limits  URINALYSIS, ROUTINE W REFLEX MICROSCOPIC -  Abnormal; Notable for the following components:   Color, Urine YELLOW (*)    APPearance CLOUDY (*)    All other components within normal limits  COMPREHENSIVE METABOLIC PANEL WITH GFR - Abnormal; Notable for the following components:   Sodium 134 (*)    All other components within normal limits  HCG, QUANTITATIVE, PREGNANCY - Abnormal; Notable for the following components:   hCG, Beta Chain, Quant, S 73,237 (*)    All other components within normal limits  POC URINE PREG, ED - Abnormal; Notable for the following components:   Preg Test, Ur Positive (*)    All other components within normal limits  CHLAMYDIA/NGC RT PCR ([facility] ONLY)            CBC WITH DIFFERENTIAL/PLATELET  ABO/RH     EKG     RADIOLOGY Ultrasound OB less than 14 weeks    PROCEDURES:   Procedures Chief Complaint  Patient presents with   Abdominal Pain   Back Pain  MEDICATIONS ORDERED IN ED: Medications - No data to display   IMPRESSION / MDM / ASSESSMENT AND PLAN / ED COURSE  I reviewed the triage vital signs and the nursing notes.                              Differential diagnosis includes, but is not limited to, pelvic pain in pregnancy, round ligament pain, threatened miscarriage, miscarriage, ectopic, UTI, STD, bacterial vaginosis  Patient's presentation is most consistent with acute illness / injury with system symptoms.   Labs, imaging ordered  POC pregnancy positive, UA reassuring beta-hCG 73,237 which is reassuring, metabolic panel CBC reassuring, ABO/Rh is O+  GC/chlamydia pending, patient will review on [place] MyChart we will call with positive  Ultrasound OB less than 14 weeks shows a gestational IUP, proximately 6 weeks, no cardiac activity, I did independently review the images, and radiologist reading interpreted as being positive pregnancy IUP.    Did explain to the patient about the ultrasound and beta-hCG.  She should get serial beta-hCG's and a repeat ultrasound  about 2 weeks to ensure everything is accurate.  Follow-up with GYN.  She is in agreement treatment plan.  Patient is insistent that with the yellow discharge and the odor she does feel like she has got BV or trichomoniasis.  Will go ahead and empirically treat her with Flagyl  500 twice daily for 7 days.  Return emergency department worsening.  She is in agreement treatment plan.  Discharged stable condition.      FINAL CLINICAL IMPRESSION(S) / ED DIAGNOSES   Final diagnoses:  Vaginal discharge  First trimester pregnancy     Rx / DC Orders   ED Discharge Orders          Ordered    metroNIDAZOLE  (FLAGYL ) 500 MG tablet  2 times daily        10/13/23 1517             Note:  This document was prepared using Dragon voice recognition software and may include unintentional dictation errors.    Delsie Figures, PA-C 10/13/23 1520    Bryson Carbine, MD 10/15/23 [phone]

## 2023-10-13 NOTE — ED Notes (Signed)
 pt is here with lower back and pelvic pain which has been increasing for 3 days. pt states that she is pregnant but not sure how far along. LMP 06/27/2023 and then had 2 days of bleeding in february but unsure if this was a regular period. no vaginal bleeding in march or april, positive pregnancy test 4/1

## 2023-10-13 NOTE — Discharge Instructions (Addendum)
 Take over-the-counter prenatal pills.  Use medication as prescribed.  Follow-up with GYN.  Return if worsening

## 2023-10-15 ENCOUNTER — Other Ambulatory Visit: Payer: Self-pay

## 2023-10-15 ENCOUNTER — Other Ambulatory Visit

## 2023-10-15 DIAGNOSIS — O200 Threatened abortion: Secondary | ICD-10-CM

## 2023-10-16 LAB — BETA HCG QUANT (REF LAB): hCG Quant: 58988 m[IU]/mL

## 2023-10-19 ENCOUNTER — Other Ambulatory Visit: Payer: Self-pay | Admitting: Obstetrics

## 2023-10-19 ENCOUNTER — Ambulatory Visit

## 2023-10-19 ENCOUNTER — Ambulatory Visit (INDEPENDENT_AMBULATORY_CARE_PROVIDER_SITE_OTHER): Admitting: Obstetrics

## 2023-10-19 ENCOUNTER — Encounter: Payer: Self-pay | Admitting: Obstetrics

## 2023-10-19 ENCOUNTER — Telehealth: Payer: Self-pay

## 2023-10-19 VITALS — BP 99/61 | HR 98 | Ht 61.0 in | Wt 167.0 lb

## 2023-10-19 DIAGNOSIS — O3680X0 Pregnancy with inconclusive fetal viability, not applicable or unspecified: Secondary | ICD-10-CM | POA: Diagnosis not present

## 2023-10-19 DIAGNOSIS — Z3A10 10 weeks gestation of pregnancy: Secondary | ICD-10-CM

## 2023-10-19 DIAGNOSIS — Z3A01 Less than 8 weeks gestation of pregnancy: Secondary | ICD-10-CM

## 2023-10-19 DIAGNOSIS — O021 Missed abortion: Secondary | ICD-10-CM | POA: Insufficient documentation

## 2023-10-19 DIAGNOSIS — O034 Incomplete spontaneous abortion without complication: Secondary | ICD-10-CM

## 2023-10-19 HISTORY — DX: Incomplete spontaneous abortion without complication: O03.4

## 2023-10-19 MED ORDER — OXYCODONE-ACETAMINOPHEN 5-325 MG PO TABS: 1.0000 | ORAL_TABLET | Freq: Three times a day (TID) | ORAL | 0 refills | Status: DC | PRN

## 2023-10-19 MED ORDER — IBUPROFEN 800 MG PO TABS: 800.0000 mg | ORAL_TABLET | Freq: Three times a day (TID) | ORAL | 0 refills | Status: DC | PRN

## 2023-10-19 NOTE — Telephone Encounter (Signed)
 TRIAGE VOICEMAIL: Patient inquiring if her blood work came back bad.

## 2023-10-19 NOTE — Telephone Encounter (Signed)
 Patient aware.

## 2023-10-19 NOTE — Progress Notes (Signed)
 GYNECOLOGY PROGRESS NOTE  Subjective:  PCP: Dorothe Gaster, NP  Patient ID: Annette White, female    DOB: 12-Sep-1995, 28 y.o.   MRN: [id number]  HPI  Patient is a 28 y.o. [id number] female who presents for missed abortion. LMP 08/08/23 making her EGA [redacted]w[redacted]d today, pregnancy unplanned, not on contraception. She presented to the ED on 10/13/23 for pelvic pain with vaginal discharge, US  showed CRL of [redacted]w[redacted]d and no cardiac activity. Pt with follow up US  today in clinic just prior to this appointment, findings showing no cardiac activity today as well, with CRL [redacted]w[redacted]d. She denies cramping, vaginal bleeding, or further discharge.   Period Cycle (Days): 30 Period Duration (Days): 5 Period Pattern: Regular Menstrual Flow: Moderate Dysmenorrhea: (!) Mild Dysmenorrhea Symptoms: Cramping  Last pap: 07/21/21 NILM, no hx of abnormals STI Hx: +CT 2023 treated Contraception: none  OB History  Gravida Para Term Preterm AB Living  5 4 4   4   SAB IAB Ectopic Multiple Live Births     0 4    # Outcome Date GA Lbr Len/2nd Weight Sex Type Anes PTL Lv  5 Current           4 Term 02/25/15   7 lb 1 oz (3.204 kg) F Vag-Spont EPI  LIV     Birth Comments: none  3 Term 10/11/13    F Vag-Spont   LIV  2 Term 12/17/11    Melven Stable Vag-Spont   LIV  1 Term 02/12/09    Darroll Emory      Past Medical History:  Diagnosis Date   No pertinent past medical history    Patient Active Problem List   Diagnosis Date Noted   Positive pregnancy test 09/21/23 09/26/2023   Abnormal urinalysis 01/04/2023   Vaginal discharge 01/04/2023   Bilateral low back pain without sciatica 01/04/2023   Encounter to establish care 01/04/2023   Trichomonas infection 03/31/22 03/31/2022   Left pyosalpinx 12/19/2021   Obesity BMI 30.4 07/21/2021    history of STDs (trich, Chlamydia, GC, PID) 07/21/2021   Smoker 07/21/2021   Vapes nicotine containing substance 07/21/2021   Nausea 01/23/2018   Eczema 07/17/2014   Allergic rhinitis due to  allergen 07/17/2014   Past Surgical History:  Procedure Laterality Date   Denies surgical hx     Family History  Problem Relation Age of Onset   Diabetes Mother    Hypertension Mother    Stroke Father    Hypertension Father    Diabetes Maternal Grandfather    Prostate cancer Maternal Grandfather    Cancer Paternal Grandmother        lung   Social History   Socioeconomic History   Marital status: Single    Spouse name: Not on file   Number of children: 4   Years of education: 11   Highest education level: 11th grade  Occupational History   Not on file  Tobacco Use   Smoking status: Every Day    Types: E-cigarettes, Cigars    Passive exposure: Current   Smokeless tobacco: Never  Vaping Use   Vaping status: Every Day   Substances: Flavoring  Substance and Sexual Activity   Alcohol use: Yes    Comment: rare   Drug use: Yes    Types: Marijuana   Sexual activity: Yes    Partners: Male  Other Topics Concern   Not on file  Social History Narrative   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)   [employer] (9)   [employer] (7)   [employer] (8)   Social Drivers of Corporate investment banker Strain: Medium Risk (05/10/2023)   Overall Financial Resource Strain (CARDIA)    Difficulty of Paying Living Expenses: Somewhat hard  Food Insecurity: Food Insecurity Present (05/10/2023)   Hunger Vital Sign    Worried About Running Out of Food in the Last Year: Sometimes true    Ran Out of Food in the Last Year: Never true  Transportation Needs: No Transportation Needs (05/10/2023)   PRAPARE - Administrator, Civil Service (Medical): No    Lack of Transportation (Non-Medical): No  Physical Activity: Insufficiently Active (05/10/2023)   Exercise Vital Sign    Days of Exercise per Week: 3 days    Minutes of Exercise per Session: 30 min  Stress: Stress Concern Present (05/10/2023)   [employer] of Occupational Health - Occupational Stress Questionnaire    Feeling of Stress :  Very much  Social Connections: Moderately Isolated (05/10/2023)   Social Connection and Isolation Panel [NHANES]    Frequency of Communication with Friends and Family: Three times a week    Frequency of Social Gatherings with Friends and Family: Three times a week    Attends Religious Services: More than 4 times per year    Active Member of Clubs or Organizations: No    Attends Banker Meetings: Not on file    Marital Status: Never married  Intimate Partner Violence: Not At Risk (07/21/2021)   Humiliation, Afraid, Rape, and Kick questionnaire    Fear of Current or Ex-Partner: No    Emotionally Abused: No    Physically Abused: No    Sexually Abused: No   Current Outpatient Medications on File Prior to Visit  Medication Sig Dispense Refill   metroNIDAZOLE  (FLAGYL ) 500 MG tablet Take 1 tablet (500 mg total) by mouth 2 (two) times daily for 7 days. 14 tablet 0   [DISCONTINUED] fluticasone  (FLONASE ) 50 MCG/ACT nasal spray Place 1 spray into both nostrils 2 (two) times daily. 16 g 0   No current facility-administered medications on file prior to visit.   Allergies  Allergen Reactions   Sulfamethoxazole-Trimethoprim Hives   Penicillins Rash    Has patient had a PCN reaction causing immediate rash, facial/tongue/throat swelling, SOB or lightheadedness with hypotension: Yes  Has patient had a PCN reaction causing severe rash involving mucus membranes or skin necrosis: No  Has patient had a PCN reaction that required hospitalization No  Has patient had a PCN reaction occurring within the last 10 years: Yes  If all of the above answers are "NO", then may proceed with Cephalosporin use.    Review of Systems Pertinent items are noted in HPI.   Objective:   Blood pressure 99/61, pulse 98, height 5\' 1"  (1.549 m), weight 167 lb (75.8 kg), last menstrual period 08/08/2023. Body mass index is 31.55 kg/m.  General appearance: alert and cooperative Abdomen: soft, non-tender;  bowel sounds normal; no masses,  no organomegaly Pelvic: deferred Extremities: extremities normal, atraumatic, no cyanosis or edema Neurologic: Grossly normal  Assessment/Plan:   1. Missed abortion    28 y.o. [id number] here today with confirmed missed abortion, would be [redacted]w[redacted]d by LMP, US  showing [redacted]w[redacted]d CRL without cardiac activity. We discussed treatment options of expectant management, medical or surgical and reviewed each option in detail. Pt would like to schedule D&C for 5/19, which would give her 3 more weeks of expectant management in the  meantime. Reviewed expectations for possible increased cramping and bleeding, Rx sent for pain medications prn. Bleeding precautions reviewed. She will contact us  and cancel D&C if she passes POCs at home. If we don't hear from her, she agrees this means she is going forward with the D&C.  -Will have scheduler reach out for confirmation -Pre-op instructions reviewed, orders placed  Total time was 43 minutes. That includes chart review before the visit, the actual patient visit, and time spent on documentation after the visit. Time excludes procedures, if any.    Sofia Dunn, DO [facility]

## 2023-10-19 NOTE — H&P (View-Only) (Signed)
 GYNECOLOGY PROGRESS NOTE  Subjective:  PCP: Dorothe Gaster, NP  Patient ID: Annette White, female    DOB: 12-Sep-1995, 28 y.o.   MRN: [id number]  HPI  Patient is a 28 y.o. [id number] female who presents for missed abortion. LMP 08/08/23 making her EGA [redacted]w[redacted]d today, pregnancy unplanned, not on contraception. She presented to the ED on 10/13/23 for pelvic pain with vaginal discharge, US  showed CRL of [redacted]w[redacted]d and no cardiac activity. Pt with follow up US  today in clinic just prior to this appointment, findings showing no cardiac activity today as well, with CRL [redacted]w[redacted]d. She denies cramping, vaginal bleeding, or further discharge.   Period Cycle (Days): 30 Period Duration (Days): 5 Period Pattern: Regular Menstrual Flow: Moderate Dysmenorrhea: (!) Mild Dysmenorrhea Symptoms: Cramping  Last pap: 07/21/21 NILM, no hx of abnormals STI Hx: +CT 2023 treated Contraception: none  OB History  Gravida Para Term Preterm AB Living  5 4 4   4   SAB IAB Ectopic Multiple Live Births     0 4    # Outcome Date GA Lbr Len/2nd Weight Sex Type Anes PTL Lv  5 Current           4 Term 02/25/15   7 lb 1 oz (3.204 kg) F Vag-Spont EPI  LIV     Birth Comments: none  3 Term 10/11/13    F Vag-Spont   LIV  2 Term 12/17/11    Melven Stable Vag-Spont   LIV  1 Term 02/12/09    Darroll Emory      Past Medical History:  Diagnosis Date   No pertinent past medical history    Patient Active Problem List   Diagnosis Date Noted   Positive pregnancy test 09/21/23 09/26/2023   Abnormal urinalysis 01/04/2023   Vaginal discharge 01/04/2023   Bilateral low back pain without sciatica 01/04/2023   Encounter to establish care 01/04/2023   Trichomonas infection 03/31/22 03/31/2022   Left pyosalpinx 12/19/2021   Obesity BMI 30.4 07/21/2021    history of STDs (trich, Chlamydia, GC, PID) 07/21/2021   Smoker 07/21/2021   Vapes nicotine containing substance 07/21/2021   Nausea 01/23/2018   Eczema 07/17/2014   Allergic rhinitis due to  allergen 07/17/2014   Past Surgical History:  Procedure Laterality Date   Denies surgical hx     Family History  Problem Relation Age of Onset   Diabetes Mother    Hypertension Mother    Stroke Father    Hypertension Father    Diabetes Maternal Grandfather    Prostate cancer Maternal Grandfather    Cancer Paternal Grandmother        lung   Social History   Socioeconomic History   Marital status: Single    Spouse name: Not on file   Number of children: 4   Years of education: 11   Highest education level: 11th grade  Occupational History   Not on file  Tobacco Use   Smoking status: Every Day    Types: E-cigarettes, Cigars    Passive exposure: Current   Smokeless tobacco: Never  Vaping Use   Vaping status: Every Day   Substances: Flavoring  Substance and Sexual Activity   Alcohol use: Yes    Comment: rare   Drug use: Yes    Types: Marijuana   Sexual activity: Yes    Partners: Male  Other Topics Concern   Not on file  Social History Narrative   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)   [employer] (9)   [employer] (7)   [employer] (6)   Social Drivers of Corporate investment banker Strain: Medium Risk (05/10/2023)   Overall Financial Resource Strain (CARDIA)    Difficulty of Paying Living Expenses: Somewhat hard  Food Insecurity: Food Insecurity Present (05/10/2023)   Hunger Vital Sign    Worried About Running Out of Food in the Last Year: Sometimes true    Ran Out of Food in the Last Year: Never true  Transportation Needs: No Transportation Needs (05/10/2023)   PRAPARE - Administrator, Civil Service (Medical): No    Lack of Transportation (Non-Medical): No  Physical Activity: Insufficiently Active (05/10/2023)   Exercise Vital Sign    Days of Exercise per Week: 3 days    Minutes of Exercise per Session: 30 min  Stress: Stress Concern Present (05/10/2023)   [employer] of Occupational Health - Occupational Stress Questionnaire    Feeling of Stress :  Very much  Social Connections: Moderately Isolated (05/10/2023)   Social Connection and Isolation Panel [NHANES]    Frequency of Communication with Friends and Family: Three times a week    Frequency of Social Gatherings with Friends and Family: Three times a week    Attends Religious Services: More than 4 times per year    Active Member of Clubs or Organizations: No    Attends Banker Meetings: Not on file    Marital Status: Never married  Intimate Partner Violence: Not At Risk (07/21/2021)   Humiliation, Afraid, Rape, and Kick questionnaire    Fear of Current or Ex-Partner: No    Emotionally Abused: No    Physically Abused: No    Sexually Abused: No   Current Outpatient Medications on File Prior to Visit  Medication Sig Dispense Refill   metroNIDAZOLE  (FLAGYL ) 500 MG tablet Take 1 tablet (500 mg total) by mouth 2 (two) times daily for 7 days. 14 tablet 0   [DISCONTINUED] fluticasone  (FLONASE ) 50 MCG/ACT nasal spray Place 1 spray into both nostrils 2 (two) times daily. 16 g 0   No current facility-administered medications on file prior to visit.   Allergies  Allergen Reactions   Sulfamethoxazole-Trimethoprim Hives   Penicillins Rash    Has patient had a PCN reaction causing immediate rash, facial/tongue/throat swelling, SOB or lightheadedness with hypotension: Yes  Has patient had a PCN reaction causing severe rash involving mucus membranes or skin necrosis: No  Has patient had a PCN reaction that required hospitalization No  Has patient had a PCN reaction occurring within the last 10 years: Yes  If all of the above answers are "NO", then may proceed with Cephalosporin use.    Review of Systems Pertinent items are noted in HPI.   Objective:   Blood pressure 99/61, pulse 98, height 5\' 1"  (1.549 m), weight 167 lb (75.8 kg), last menstrual period 08/08/2023. Body mass index is 31.55 kg/m.  General appearance: alert and cooperative Abdomen: soft, non-tender;  bowel sounds normal; no masses,  no organomegaly Pelvic: deferred Extremities: extremities normal, atraumatic, no cyanosis or edema Neurologic: Grossly normal  Assessment/Plan:   1. Missed abortion    28 y.o. [id number] here today with confirmed missed abortion, would be [redacted]w[redacted]d by LMP, US  showing [redacted]w[redacted]d CRL without cardiac activity. We discussed treatment options of expectant management, medical or surgical and reviewed each option in detail. Pt would like to schedule D&C for 5/19, which would give her 3 more weeks of expectant management in the  meantime. Reviewed expectations for possible increased cramping and bleeding, Rx sent for pain medications prn. Bleeding precautions reviewed. She will contact us  and cancel D&C if she passes POCs at home. If we don't hear from her, she agrees this means she is going forward with the D&C.  -Will have scheduler reach out for confirmation -Pre-op instructions reviewed, orders placed  Total time was 43 minutes. That includes chart review before the visit, the actual patient visit, and time spent on documentation after the visit. Time excludes procedures, if any.    Sofia Dunn, DO [facility]

## 2023-10-19 NOTE — Patient Instructions (Signed)
 GYNECOLOGY PRE-OPERATIVE INSTRUCTIONS  You are scheduled for surgery on 11/08/23.  The name of your procedure is: dilation and curettage (D&C).   Please read through these instructions carefully regarding preparation for your surgery: Nothing to eat after midnight on the day prior to surgery.  Do not take any medications unless recommended by your provider on day prior to surgery.  Do not take NSAIDs (Motrin , Aleve) or aspirin 3 days prior to surgery.  You may take Tylenol  products for minor aches and pains.  You will receive a prescription for pain medications post-operatively.  You will be contacted by phone approximately 1-2 weeks prior to surgery to schedule your pre-operative appointment.  Please call the office if you have any questions regarding your upcoming surgery.    Thank you for choosing [facility].

## 2023-10-21 DIAGNOSIS — O021 Missed abortion: Secondary | ICD-10-CM

## 2023-10-21 HISTORY — DX: Missed abortion: O02.1

## 2023-11-01 ENCOUNTER — Inpatient Hospital Stay: Admission: RE | Admit: 2023-11-01 | Source: Ambulatory Visit

## 2023-11-02 ENCOUNTER — Telehealth: Payer: Self-pay

## 2023-11-02 ENCOUNTER — Encounter
Admission: RE | Admit: 2023-11-02 | Discharge: 2023-11-02 | Disposition: A | Discharge status: Home / Self Care | Source: Ambulatory Visit | Attending: Obstetrics | Admitting: Obstetrics

## 2023-11-02 ENCOUNTER — Other Ambulatory Visit: Payer: Self-pay

## 2023-11-02 DIAGNOSIS — O021 Missed abortion: Secondary | ICD-10-CM

## 2023-11-02 NOTE — Telephone Encounter (Signed)
 Left voicemail to return call.

## 2023-11-02 NOTE — Patient Instructions (Addendum)
 Your procedure is scheduled on: Monday, May 19 Report to the Registration Desk on the 1st floor of the [facility] To find out your arrival time, please call [phone] between 1PM - 3PM on: Friday, May 16 If your arrival time is 6:00 am, do not arrive before that time as the [facility] entrance doors do not open until 6:00 am.  REMEMBER: Instructions that are not followed completely may result in serious medical risk, up to and including death; or upon the discretion of your surgeon and anesthesiologist your surgery may need to be rescheduled.  Do not eat or drink after midnight the night before surgery.  No gum chewing or hard candies.  One week prior to surgery: starting May 13 Stop Anti-inflammatories (NSAIDS) such as Advil , Aleve, Ibuprofen , Motrin , Naproxen, Naprosyn and Aspirin based products such as Excedrin, Goody's Powder, BC Powder. Stop ANY OVER THE COUNTER supplements until after surgery.  You may however, continue to take Tylenol  if needed for pain up until the day of surgery.  Continue taking all of your other prescription medications up until the day of surgery.  ON THE DAY OF SURGERY DO NOT TAKE ANY MEDICATIONS   No Alcohol for 24 hours before or after surgery.  No Smoking including e-cigarettes for 24 hours before surgery.  No chewable tobacco products for at least 6 hours before surgery.  No nicotine patches on the day of surgery.  Do not use any "recreational" drugs for at least a week (preferably 2 weeks) before your surgery.  Please be advised that the combination of cocaine and anesthesia may have negative outcomes, up to and including death. If you test positive for cocaine, your surgery will be cancelled.  On the morning of surgery brush your teeth with toothpaste and water, you may rinse your mouth with mouthwash if you wish. Do not swallow any toothpaste or mouthwash.  Do not wear jewelry, make-up, hairpins, clips or nail polish.  For welded  (permanent) jewelry: bracelets, anklets, waist bands, etc.  Please have this removed prior to surgery.  If it is not removed, there is a chance that hospital personnel will need to cut it off on the day of surgery.  Do not wear lotions, powders, or perfumes.   Do not shave body hair from the neck down 48 hours before surgery.  Contact lenses, hearing aids and dentures may not be worn into surgery.  Do not bring valuables to the hospital. [facility] is not responsible for any missing/lost belongings or valuables.   Notify your doctor if there is any change in your medical condition (cold, fever, infection).  Wear comfortable clothing (specific to your surgery type) to the hospital.  After surgery, you can help prevent lung complications by doing breathing exercises.  Take deep breaths and cough every 1-2 hours.   If you are being discharged the day of surgery, you will not be allowed to drive home. You will need a responsible individual to drive you home and stay with you for 24 hours after surgery.   If you are taking public transportation, you will need to have a responsible individual with you.  Please call the Pre-admissions Testing Dept. at [phone] if you have any questions about these instructions.  Surgery Visitation Policy:  Patients having surgery or a procedure may have two visitors.  Children under the age of 60 must have an adult with them who is not the patient.

## 2023-11-02 NOTE — Telephone Encounter (Addendum)
 Spoke with patient. She states the first two days it was just spotting. Now it's like a normal period. She is changing pads every 2-3 hours. She did notice a nickel sized piece of tissue in the toilet one time. Spoke with Dr. Dell Fennel, she would like patient to come for Beta today and U/S on Thursday. If U/S still shows retained products, will continue with [facility] 5/19. Lab appt scheduled for 5/14 at 8:20 (STAT) U/S scheduled for 5/15 @5  at [facility]. Arrive at 4:45 with full bladder.

## 2023-11-02 NOTE — Addendum Note (Signed)
 Addended by: Arcelia Bean on: 11/02/2023 04:23 PM   Modules accepted: Orders

## 2023-11-02 NOTE — Telephone Encounter (Signed)
 TRIAGE VOICEMAIL: Patient states she is scheduled for [facility] Monday 5/19. She started bleeding on Friday 10/29/23. Inquiring if she still needs the D&C.

## 2023-11-03 ENCOUNTER — Encounter: Payer: Self-pay | Admitting: Obstetrics

## 2023-11-03 ENCOUNTER — Other Ambulatory Visit

## 2023-11-03 DIAGNOSIS — O021 Missed abortion: Secondary | ICD-10-CM

## 2023-11-03 LAB — BETA HCG QUANT (REF LAB): hCG Quant: 9733 m[IU]/mL

## 2023-11-04 ENCOUNTER — Ambulatory Visit
Admission: RE | Admit: 2023-11-04 | Discharge: 2023-11-04 | Disposition: A | Discharge status: Home / Self Care | Source: Ambulatory Visit | Attending: Obstetrics | Admitting: Obstetrics

## 2023-11-04 ENCOUNTER — Other Ambulatory Visit: Payer: Self-pay | Admitting: Obstetrics

## 2023-11-04 DIAGNOSIS — O021 Missed abortion: Secondary | ICD-10-CM | POA: Diagnosis present

## 2023-11-05 ENCOUNTER — Ambulatory Visit: Payer: Self-pay | Admitting: Obstetrics

## 2023-11-08 ENCOUNTER — Ambulatory Visit
Admission: RE | Admit: 2023-11-08 | Discharge: 2023-11-08 | Disposition: A | Discharge status: Home / Self Care | Attending: Obstetrics | Admitting: Obstetrics

## 2023-11-08 ENCOUNTER — Encounter
Admission: RE | Disposition: A | Discharge status: Home / Self Care | Payer: Self-pay | Source: Home / Self Care | Attending: Obstetrics

## 2023-11-08 ENCOUNTER — Ambulatory Visit: Payer: Self-pay | Admitting: Urgent Care

## 2023-11-08 ENCOUNTER — Other Ambulatory Visit: Payer: Self-pay

## 2023-11-08 ENCOUNTER — Encounter: Payer: Self-pay | Admitting: Obstetrics

## 2023-11-08 DIAGNOSIS — Z3A01 Less than 8 weeks gestation of pregnancy: Secondary | ICD-10-CM

## 2023-11-08 DIAGNOSIS — O034 Incomplete spontaneous abortion without complication: Secondary | ICD-10-CM | POA: Diagnosis not present

## 2023-11-08 DIAGNOSIS — Z5986 Financial insecurity: Secondary | ICD-10-CM | POA: Diagnosis not present

## 2023-11-08 DIAGNOSIS — O021 Missed abortion: Secondary | ICD-10-CM

## 2023-11-08 DIAGNOSIS — J45909 Unspecified asthma, uncomplicated: Secondary | ICD-10-CM | POA: Diagnosis not present

## 2023-11-08 DIAGNOSIS — Z5941 Food insecurity: Secondary | ICD-10-CM | POA: Insufficient documentation

## 2023-11-08 HISTORY — PX: DILATION AND EVACUATION: SHX1459

## 2023-11-08 LAB — TYPE AND SCREEN
ABO/RH(D): O POS
Antibody Screen: NEGATIVE

## 2023-11-08 SURGERY — DILATION AND EVACUATION, UTERUS
Anesthesia: General

## 2023-11-08 MED ORDER — OXYCODONE HCL 5 MG PO TABS: 5.0000 mg | ORAL_TABLET | Freq: Once | ORAL | Status: DC | PRN

## 2023-11-08 MED ORDER — ACETAMINOPHEN 500 MG PO TABS
ORAL_TABLET | ORAL | Status: AC
  Filled 2023-11-08: qty 2

## 2023-11-08 MED ORDER — DEXAMETHASONE SODIUM PHOSPHATE 10 MG/ML IJ SOLN
INTRAMUSCULAR | Status: DC | PRN
  Administered 2023-11-08: 10 mg via INTRAVENOUS

## 2023-11-08 MED ORDER — 0.9 % SODIUM CHLORIDE (POUR BTL) OPTIME
TOPICAL | Status: DC | PRN
  Administered 2023-11-08: 500 mL

## 2023-11-08 MED ORDER — SODIUM CHLORIDE 0.9% FLUSH
3.0000 mL | INTRAVENOUS | Status: DC | PRN
Start: 2023-11-08 — End: 2023-11-08

## 2023-11-08 MED ORDER — PROPOFOL 10 MG/ML IV BOLUS
INTRAVENOUS | Status: AC
  Filled 2023-11-08: qty 20

## 2023-11-08 MED ORDER — LIDOCAINE HCL (CARDIAC) PF 100 MG/5ML IV SOSY
PREFILLED_SYRINGE | INTRAVENOUS | Status: DC | PRN
  Administered 2023-11-08: 100 mg via INTRAVENOUS

## 2023-11-08 MED ORDER — ACETAMINOPHEN 500 MG PO TABS
1000.0000 mg | ORAL_TABLET | ORAL | Status: AC
Start: 2023-11-08 — End: 2023-11-08
  Administered 2023-11-08: 1000 mg via ORAL

## 2023-11-08 MED ORDER — OXYCODONE HCL 5 MG/5ML PO SOLN: 5.0000 mg | Freq: Once | ORAL | Status: DC | PRN

## 2023-11-08 MED ORDER — CHLORHEXIDINE GLUCONATE 0.12 % MT SOLN
15.0000 mL | Freq: Once | OROMUCOSAL | Status: AC
  Administered 2023-11-08: 15 mL via OROMUCOSAL

## 2023-11-08 MED ORDER — MIDAZOLAM HCL 2 MG/2ML IJ SOLN
INTRAMUSCULAR | Status: DC | PRN
  Administered 2023-11-08: 2 mg via INTRAVENOUS

## 2023-11-08 MED ORDER — FENTANYL CITRATE (PF) 100 MCG/2ML IJ SOLN
INTRAMUSCULAR | Status: AC
  Filled 2023-11-08: qty 2

## 2023-11-08 MED ORDER — LACTATED RINGERS IV SOLN: INTRAVENOUS | Status: DC

## 2023-11-08 MED ORDER — PROPOFOL 10 MG/ML IV BOLUS
INTRAVENOUS | Status: DC | PRN
  Administered 2023-11-08: 150 mg via INTRAVENOUS
  Administered 2023-11-08: 50 mg via INTRAVENOUS

## 2023-11-08 MED ORDER — FENTANYL CITRATE (PF) 100 MCG/2ML IJ SOLN: 25.0000 ug | INTRAMUSCULAR | Status: DC | PRN

## 2023-11-08 MED ORDER — ONDANSETRON HCL 4 MG/2ML IJ SOLN
INTRAMUSCULAR | Status: DC | PRN
  Administered 2023-11-08: 4 mg via INTRAVENOUS

## 2023-11-08 MED ORDER — ORAL CARE MOUTH RINSE: 15.0000 mL | Freq: Once | OROMUCOSAL | Status: AC

## 2023-11-08 MED ORDER — DOXYCYCLINE HYCLATE 100 MG IV SOLR
200.0000 mg | INTRAVENOUS | Status: AC
  Administered 2023-11-08: 200 mg via INTRAVENOUS
  Filled 2023-11-08: qty 200

## 2023-11-08 MED ORDER — CHLORHEXIDINE GLUCONATE 0.12 % MT SOLN
OROMUCOSAL | Status: AC
  Filled 2023-11-08: qty 15

## 2023-11-08 MED ORDER — FENTANYL CITRATE (PF) 100 MCG/2ML IJ SOLN
INTRAMUSCULAR | Status: DC | PRN
Start: 2023-11-08 — End: 2023-11-08
  Administered 2023-11-08: 50 ug via INTRAVENOUS

## 2023-11-08 MED ORDER — SODIUM CHLORIDE 0.9% FLUSH: 3.0000 mL | Freq: Two times a day (BID) | INTRAVENOUS | Status: DC

## 2023-11-08 MED ORDER — IBUPROFEN 800 MG PO TABS: 800.0000 mg | ORAL_TABLET | Freq: Three times a day (TID) | ORAL | 0 refills | Status: DC | PRN

## 2023-11-08 MED ORDER — MIDAZOLAM HCL 2 MG/2ML IJ SOLN
INTRAMUSCULAR | Status: AC
  Filled 2023-11-08: qty 2

## 2023-11-08 SURGICAL SUPPLY — 22 items
DRSG TELFA 3X8 NADH STRL (GAUZE/BANDAGES/DRESSINGS) ×1 IMPLANT
FILTER UTR ASPR SPEC (MISCELLANEOUS) ×1 IMPLANT
GLOVE BIO SURGEON STRL SZ 6 (GLOVE) ×1 IMPLANT
GLOVE BIOGEL PI IND STRL 6 (GLOVE) ×1 IMPLANT
GOWN STRL REUS W/ TWL LRG LVL3 (GOWN DISPOSABLE) ×1 IMPLANT
KIT BERKELEY 1ST TRIMESTER 3/8 (MISCELLANEOUS) ×1 IMPLANT
KIT TURNOVER KIT A (KITS) ×1 IMPLANT
NDL SPNL 20GX3.5 QUINCKE YW (NEEDLE) ×1 IMPLANT
NEEDLE SPNL 20GX3.5 QUINCKE YW (NEEDLE) ×1 IMPLANT
PACK DNC HYST (MISCELLANEOUS) ×1 IMPLANT
PAD OB MATERNITY 11 LF (PERSONAL CARE ITEMS) ×1 IMPLANT
PAD PREP OB/GYN DISP 24X41 (PERSONAL CARE ITEMS) ×1 IMPLANT
SCRUB CHG 4% DYNA-HEX 4OZ (MISCELLANEOUS) ×1 IMPLANT
SET BERKELEY SUCTION TUBING (SUCTIONS) ×1 IMPLANT
SOLUTION PREP PVP 2OZ (MISCELLANEOUS) ×1 IMPLANT
SYR 10ML LL (SYRINGE) ×1 IMPLANT
TRAP FLUID SMOKE EVACUATOR (MISCELLANEOUS) ×1 IMPLANT
VACURETTE 10 RIGID CVD (CANNULA) IMPLANT
VACURETTE 12 RIGID CVD (CANNULA) IMPLANT
VACURETTE 7MM F TIP STRL (CANNULA) ×1 IMPLANT
VACURETTE 8 RIGID CVD (CANNULA) IMPLANT
WATER STERILE IRR 500ML POUR (IV SOLUTION) ×1 IMPLANT

## 2023-11-08 NOTE — Anesthesia Preprocedure Evaluation (Signed)
 Anesthesia Evaluation  Patient identified by MRN, date of birth, ID band Patient awake    Reviewed: Allergy & Precautions, NPO status , Patient's Chart, lab work & pertinent test results  History of Anesthesia Complications Negative for: history of anesthetic complications  Airway Mallampati: III  TM Distance: >3 FB Neck ROM: full    Dental  (+) Chipped   Pulmonary neg shortness of breath, asthma    Pulmonary exam normal        Cardiovascular Exercise Tolerance: Good (-) angina (-) Past MI negative cardio ROS Normal cardiovascular exam     Neuro/Psych negative neurological ROS  negative psych ROS   GI/Hepatic negative GI ROS, Neg liver ROS,neg GERD  ,,  Endo/Other  negative endocrine ROS    Renal/GU      Musculoskeletal   Abdominal   Peds  Hematology negative hematology ROS (+)   Anesthesia Other Findings Past Medical History: 10/2023: Missed abortion No date: No pertinent past medical history  Past Surgical History: No date: NO PAST SURGERIES  BMI    Body Mass Index: 31.55 kg/m      Reproductive/Obstetrics negative OB ROS                             Anesthesia Physical Anesthesia Plan  ASA: 2  Anesthesia Plan: General LMA   Post-op Pain Management:    Induction: Intravenous  PONV Risk Score and Plan: Dexamethasone , Ondansetron , Midazolam  and Treatment may vary due to age or medical condition  Airway Management Planned: LMA  Additional Equipment:   Intra-op Plan:   Post-operative Plan: Extubation in OR  Informed Consent: I have reviewed the patients History and Physical, chart, labs and discussed the procedure including the risks, benefits and alternatives for the proposed anesthesia with the patient or authorized representative who has indicated his/her understanding and acceptance.     Dental Advisory Given  Plan Discussed with: Anesthesiologist, CRNA and  Surgeon  Anesthesia Plan Comments: (Patient consented for risks of anesthesia including but not limited to:  - adverse reactions to medications - damage to eyes, teeth, lips or other oral mucosa - nerve damage due to positioning  - sore throat or hoarseness - Damage to heart, brain, nerves, lungs, other parts of body or loss of life  Patient voiced understanding and assent.)       Anesthesia Quick Evaluation

## 2023-11-08 NOTE — Transfer of Care (Signed)
 Immediate Anesthesia Transfer of Care Note  Patient: Annette White  Procedure(s) Performed: DILATION AND EVACUATION, UTERUS  Patient Location: PACU  Anesthesia Type:General  Level of Consciousness: awake, alert , and oriented  Airway & Oxygen Therapy: Patient Spontanous Breathing  Post-op Assessment: Report given to RN and Post -op Vital signs reviewed and stable  Post vital signs: Reviewed and stable  Last Vitals:  Vitals Value Taken Time  BP 107/63 11/08/23 0927  Temp    Pulse 82 11/08/23 0928  Resp 19 11/08/23 0928  SpO2 100 % 11/08/23 0928  Vitals shown include unfiled device data.  Last Pain:  Vitals:   11/08/23 0746  TempSrc: Temporal  PainSc: 0-No pain         Complications: No notable events documented.

## 2023-11-08 NOTE — Interval H&P Note (Signed)
 History and Physical Interval Note:  11/08/2023 7:41 AM  Annette White  has presented today for surgery, with the diagnosis of Missed Abortion.  The various methods of treatment have been discussed with the patient and family. After consideration of risks, benefits and other options for treatment, the patient has consented to  Procedure(s) with comments: DILATION AND EVACUATION, UTERUS (N/A) - D&C as a surgical intervention.  The patient's history has been reviewed, patient examined, no change in status, stable for surgery.  I have reviewed the patient's chart and labs.  Questions were answered to the patient's satisfaction.     Eugenio Dollins

## 2023-11-08 NOTE — Anesthesia Postprocedure Evaluation (Signed)
 Anesthesia Post Note  Patient: Annette White  Procedure(s) Performed: DILATION AND EVACUATION, UTERUS  Patient location during evaluation: PACU Anesthesia Type: General Level of consciousness: awake and alert Pain management: pain level controlled Vital Signs Assessment: post-procedure vital signs reviewed and stable Respiratory status: spontaneous breathing, nonlabored ventilation, respiratory function stable and patient connected to nasal cannula oxygen Cardiovascular status: blood pressure returned to baseline and stable Postop Assessment: no apparent nausea or vomiting Anesthetic complications: no   No notable events documented.   Last Vitals:  Vitals:   11/08/23 0945 11/08/23 0953  BP:  104/62  Pulse: 90 92  Resp: 17 20  Temp: (!) 36.4 C (!) 36.1 C  SpO2: 99% 100%    Last Pain:  Vitals:   11/08/23 0953  TempSrc: Temporal  PainSc: 0-No pain                 Portia Brittle Yen Wandell

## 2023-11-08 NOTE — Anesthesia Procedure Notes (Signed)
 Procedure Name: LMA Insertion Date/Time: 11/08/2023 8:51 AM  Performed by: Manya Sells, CRNAPre-anesthesia Checklist: Patient identified, Patient being monitored, Timeout performed, Emergency Drugs available and Suction available Patient Re-evaluated:Patient Re-evaluated prior to induction Oxygen Delivery Method: Circle system utilized Preoxygenation: Pre-oxygenation with 100% oxygen Induction Type: IV induction Ventilation: Mask ventilation without difficulty LMA: LMA inserted LMA Size: 3.0 Tube type: Oral Number of attempts: 1 Placement Confirmation: positive ETCO2 and breath sounds checked- equal and bilateral Tube secured with: Tape Dental Injury: Teeth and Oropharynx as per pre-operative assessment

## 2023-11-08 NOTE — Op Note (Signed)
 DILATION AND CURETTAGE OPERATIVE REPORT  DATE: 11/08/23  PRE-OP DIAGNOSIS: Incomplete abortion at 6 weeks  POST-OP DIAGNOSIS: Same  PROCEDURE: D&C  SURGEON: Dr. Sofia Dunn  ANESTHESIA: General  IVF:  EBL: <47mL  UOP: 50mL  COMPLICATIONS: None  SPECIMEN: Products of conception  FINDINGS: 7-week sized anteverted uterus with cervix 0.5cm dilated; small amounts of products of conception  DRAINS: None  CONDITION: Stable to recovery room  PROCEDURE: The risks, benefits, alternatives and indications of the procedure were discussed with the patient. She voiced an understanding of the procedure and informed consent was obtained.  The patient was taken to the OR where general anesthesia was administered without difficulty. She was placed in the dorsal lithotomy position using the Allen stirrups. And exam under anesthesia revealed a 7-week sized anteverted uterus with the cervix 0.5cm dilated. The patient was then prepped and draped in the normal sterile fashion.   A weighted speculum was inserted in the posterior aspect of the vagina. A single-tooth tenaculum was used to grasp the anterior lip of the cervix. The uterus was carefully sounded to 7cm. An 7mm suction curette was advanced to the uterine fundus. The suction was them started. The products of conception were evacuated with the curette in a rotation motion on the outward movement. A gentle sharp  curettage was then performed with a large curette with a gritty texture felt in all four quadrants. The suction curette was then reintroduced to clear the uterus. The tenaculum was removed from the cervix and good hemostasis was noted.   The patient tolerated the procedure well. She was taken to the recovery room in stable condition.    Sofia Dunn, DO [facility]

## 2023-11-09 ENCOUNTER — Encounter: Payer: Self-pay | Admitting: Obstetrics

## 2023-11-09 LAB — SURGICAL PATHOLOGY

## 2023-11-19 NOTE — Progress Notes (Signed)
    OBSTETRICS/GYNECOLOGY POST-OPERATIVE CLINIC VISIT  Subjective:     Annette White is a 28 y.o. [id number] who presents to the clinic 2 weeks status post DILATION AND CURETTAGE  for  Incomplete miscarriage at 6 weeks . Eating a regular diet without difficulty. Bowel movements are normal. The patient is not having any pain. Not having any bleeding. Would like to start contraception today, requesting COC, has never used before. Denies hx of migraines w/aura, VTE, and does not smoke.   The following portions of the patient's history were reviewed and updated as appropriate: allergies, current medications, past family history, past medical history, past social history, past surgical history, and problem list.  Review of Systems Pertinent items are noted in HPI.   Objective:   BP (!) 102/56   Pulse 95   Ht 5\' 1"  (1.549 m)   Wt 168 lb (76.2 kg)   BMI 31.74 kg/m  Body mass index is 31.74 kg/m.  General:  alert and no distress  Abdomen: soft, bowel sounds active, non-tender  Incision:   N/A    Pathology:   11/08/23 FINAL DIAGNOSIS        1. Products of Conception,  :       - PRODUCTS OF CONCEPTION (RARE CHORIONIC VILLI)   Assessment:  Patient s/p D&C for incomplete SAB Doing well postoperatively. Also wanting to start COCs today Plan:   Operative findings again reviewed. Pathology report discussed. Discussed resumption of menses; should regulate with COCs Discussed contraception options including LARC, pt would like COCs Start oral contraceptives as prescribed: Loestrin, cyclic periods, start immediately Cardiovascular and other risks discussed, aware that smoking while on OCPs increases the risk of blood clots.  SEs discussed, including BTB, N&V, weight changes, increased moodiness. If symptoms are severe, or mild SEs do not improve after 3 mos, call or RTC to discuss options.  Refrain from intercourse or use barrier contraception x 7 days after starting OCPs.  Aware that  OCPs protect against pregnancy only, not STDs.  Follow up in 12 months for annual, sooner prn.   Sofia Dunn, DO [facility]

## 2023-11-23 ENCOUNTER — Ambulatory Visit (INDEPENDENT_AMBULATORY_CARE_PROVIDER_SITE_OTHER): Admitting: Obstetrics

## 2023-11-23 ENCOUNTER — Encounter: Payer: Self-pay | Admitting: Obstetrics

## 2023-11-23 VITALS — BP 102/56 | HR 95 | Ht 61.0 in | Wt 168.0 lb

## 2023-11-23 DIAGNOSIS — Z30011 Encounter for initial prescription of contraceptive pills: Secondary | ICD-10-CM

## 2023-11-23 DIAGNOSIS — Z9889 Other specified postprocedural states: Secondary | ICD-10-CM

## 2023-11-23 DIAGNOSIS — Z4889 Encounter for other specified surgical aftercare: Secondary | ICD-10-CM

## 2023-11-23 MED ORDER — NORETHIN ACE-ETH ESTRAD-FE 1-20 MG-MCG PO TABS: 1.0000 | ORAL_TABLET | Freq: Every day | ORAL | 4 refills | Status: DC

## 2023-12-15 ENCOUNTER — Telehealth: Admitting: Physician Assistant

## 2023-12-15 DIAGNOSIS — R300 Dysuria: Secondary | ICD-10-CM

## 2023-12-15 MED ORDER — CEPHALEXIN 500 MG PO CAPS: 500.0000 mg | ORAL_CAPSULE | Freq: Two times a day (BID) | ORAL | 0 refills | Status: AC

## 2023-12-15 NOTE — Progress Notes (Signed)

## 2024-01-28 ENCOUNTER — Ambulatory Visit: Admitting: Nurse Practitioner

## 2024-01-28 VITALS — BP 118/72 | HR 91 | Temp 98.3°F | Ht 61.0 in | Wt 167.4 lb

## 2024-01-28 DIAGNOSIS — Z3200 Encounter for pregnancy test, result unknown: Secondary | ICD-10-CM

## 2024-01-28 DIAGNOSIS — R350 Frequency of micturition: Secondary | ICD-10-CM

## 2024-01-28 DIAGNOSIS — R1013 Epigastric pain: Secondary | ICD-10-CM | POA: Diagnosis not present

## 2024-01-28 DIAGNOSIS — R110 Nausea: Secondary | ICD-10-CM

## 2024-01-28 DIAGNOSIS — Z8349 Family history of other endocrine, nutritional and metabolic diseases: Secondary | ICD-10-CM

## 2024-01-28 DIAGNOSIS — R0789 Other chest pain: Secondary | ICD-10-CM | POA: Diagnosis not present

## 2024-01-28 LAB — POCT URINALYSIS DIPSTICK
Bilirubin, UA: NEGATIVE
Blood, UA: NEGATIVE
Glucose, UA: NEGATIVE
Ketones, UA: NEGATIVE
Leukocytes, UA: NEGATIVE
Nitrite, UA: NEGATIVE
Protein, UA: POSITIVE — AB
Spec Grav, UA: 1.015 (ref 1.010–1.025)
Urobilinogen, UA: 0.2 U/dL
pH, UA: 7 (ref 5.0–8.0)

## 2024-01-28 LAB — POCT URINE PREGNANCY: Preg Test, Ur: NEGATIVE

## 2024-01-28 MED ORDER — OMEPRAZOLE 20 MG PO CPDR: 20.0000 mg | DELAYED_RELEASE_CAPSULE | Freq: Every day | ORAL | 0 refills | Status: DC

## 2024-01-28 NOTE — Assessment & Plan Note (Signed)
 Ambiguous in nature.  Patient recent had D&C she does have some epigastric pain vapes and occasional alcohol no NSAIDs query ulcer will treat with omeprazole  20 mg daily

## 2024-01-28 NOTE — Assessment & Plan Note (Signed)
 UA in office

## 2024-01-28 NOTE — Progress Notes (Signed)
 Acute Office Visit  Subjective:     Patient ID: EWA HIPP, female    DOB: January 23, 1996, 28 y.o.   MRN: [id number]  Chief Complaint  Patient presents with   Urinary Tract Infection    Pt complains of symptoms starting in July. Pt states she took AZO and dysuria went away. Pt complains back pain, nausea, slight cramping.     HPI Patient is in today for urinary frequency With a history of allergic rhinitis, eczema, STD, tobacco use.   Beginning of July. States that she took azo for the first two days. States that she went to the telelmedicine and got a abx that was sent to pharmacy but she did not take it because of an allergy  States that it startred back for 2 weeks  Back pain that is a dull aching pain that comes and goes. She also has lef inguinal pain that is intermittent Statse no dischagre no change of pregnacy  Nuase is dialy at different and through out day Stase s that if she does not eat it is worse   Review of Systems  Constitutional:  Negative for chills and fever.  Respiratory:  Negative for shortness of breath.   Cardiovascular:  Positive for chest pain (sharp that comes and goes. last 1-2 mins).  Gastrointestinal:  Positive for nausea. Negative for abdominal pain, constipation, diarrhea and vomiting.       Bm daily   Last BM today   Genitourinary:  Positive for frequency. Negative for dysuria and hematuria.  Musculoskeletal:  Positive for back pain.  Neurological:  Negative for tingling.        Objective:    BP 118/72   Pulse 91   Temp 98.3 F (36.8 C) (Oral)   Ht 5' 1 (1.549 m)   Wt 167 lb 6.4 oz (75.9 kg)   SpO2 96%   BMI 31.63 kg/m    Physical Exam Vitals and nursing note reviewed.  Constitutional:      Appearance: Normal appearance.  Cardiovascular:     Rate and Rhythm: Normal rate and regular rhythm.     Heart sounds: Normal heart sounds.  Pulmonary:     Effort: Pulmonary effort is normal.     Breath sounds: Normal breath  sounds.  Abdominal:     General: Bowel sounds are normal. There is no distension.     Palpations: There is no mass.     Tenderness: There is abdominal tenderness. There is no right CVA tenderness or left CVA tenderness.     Hernia: No hernia is present.  Musculoskeletal:        General: No tenderness.     Lumbar back: No tenderness or bony tenderness. Negative right straight leg raise test and negative left straight leg raise test.       Back:     Comments: Lumbar flexion, extension, lateral slide, lateral rotation did not elicit discomfort in office  Neurological:     Mental Status: She is alert.     Deep Tendon Reflexes:     Reflex Scores:      Patellar reflexes are 2+ on the right side and 2+ on the left side.    Comments: Bilateral lower extremity strength 5/5     Results for orders placed or performed in visit on 01/28/24  POCT urinalysis dipstick  Result Value Ref Range   Color, UA yellow    Clarity, UA cloudy    Glucose, UA Negative Negative   Bilirubin, UA  neg    Ketones, UA neg    Spec Grav, UA 1.015 1.010 - 1.025   Blood, UA neg    pH, UA 7.0 5.0 - 8.0   Protein, UA Positive (A) Negative   Urobilinogen, UA 0.2 0.2 or 1.0 E.U./dL   Nitrite, UA neg    Leukocytes, UA Negative Negative   Appearance     Odor    POCT urine pregnancy  Result Value Ref Range   Preg Test, Ur Negative Negative        Assessment & Plan:   Problem List Items Addressed This Visit       Other   Nausea   Ambiguous in nature.  Patient recent had D&C she does have some epigastric pain vapes and occasional alcohol no NSAIDs query ulcer will treat with omeprazole  20 mg daily      Urinary frequency   UA in office      Relevant Orders   POCT urinalysis dipstick (Completed)   Urine Culture   Atypical chest pain   Query reflux versus ulcer.  EKG in office within normal limits.      Relevant Orders   EKG 12-Lead   Epigastric pain - Primary   Query ulcer occasional alcohol use  with electronic cigarette use.  Patient denies any NSAID use pending CBC, CMP, lipase, H. pylori.  Will treat with omeprazole  20 mg daily      Relevant Orders   CBC   Comprehensive metabolic panel with GFR   Lipase   Helicobacter Pylori Special Antigen, Stool   Other Visit Diagnoses       Possible pregnancy       Relevant Orders   POCT urine pregnancy (Completed)       Meds ordered this encounter  Medications   omeprazole  (PRILOSEC) 20 MG capsule    Sig: Take 1 capsule (20 mg total) by mouth daily.    Dispense:  30 capsule    Refill:  0    Supervising Provider:   RANDEEN HARDY A [[id number]]    Return in about 3 weeks (around 02/18/2024) for nausea, epigastric pain .  Annette Crandall, NP

## 2024-01-28 NOTE — Addendum Note (Signed)
 Addended by: WENDEE LYNWOOD HERO on: 01/28/2024 04:53 PM   Modules accepted: Orders

## 2024-01-28 NOTE — Patient Instructions (Signed)
 Nice to see you today I will be in touch with the labs once I have them Follow up with me in approx 3 week, sooner if you need me

## 2024-01-28 NOTE — Addendum Note (Signed)
 Addended by: ISADORA RAISIN on: 01/28/2024 04:54 PM   Modules accepted: Orders

## 2024-01-28 NOTE — Assessment & Plan Note (Signed)
 Query ulcer occasional alcohol use with electronic cigarette use.  Patient denies any NSAID use pending CBC, CMP, lipase, H. pylori.  Will treat with omeprazole  20 mg daily

## 2024-01-28 NOTE — Assessment & Plan Note (Signed)
 Query reflux versus ulcer.  EKG in office within normal limits.

## 2024-01-29 LAB — TSH: TSH: 1.2 m[IU]/L

## 2024-01-29 LAB — COMPREHENSIVE METABOLIC PANEL WITH GFR
AG Ratio: 1.7 (calc) (ref 1.0–2.5)
ALT: 11 U/L (ref 6–29)
AST: 13 U/L (ref 10–30)
Albumin: 4.3 g/dL (ref 3.6–5.1)
Alkaline phosphatase (APISO): 51 U/L (ref 31–125)
BUN: 14 mg/dL (ref 7–25)
CO2: 24 mmol/L (ref 20–32)
Calcium: 9.1 mg/dL (ref 8.6–10.2)
Chloride: 107 mmol/L (ref 98–110)
Creat: 0.7 mg/dL (ref 0.50–0.97)
Globulin: 2.5 g/dL (ref 1.9–3.7)
Glucose, Bld: 100 mg/dL — ABNORMAL HIGH (ref 65–99)
Potassium: 3.9 mmol/L (ref 3.5–5.3)
Sodium: 139 mmol/L (ref 135–146)
Total Bilirubin: 0.5 mg/dL (ref 0.2–1.2)
Total Protein: 6.8 g/dL (ref 6.1–8.1)
eGFR: 119 mL/min/1.73m2 (ref >=60)

## 2024-01-29 LAB — CBC
HCT: 36.7 % (ref 35.0–45.0)
Hemoglobin: 12 g/dL (ref 11.7–15.5)
MCH: 29.6 pg (ref 27.0–33.0)
MCHC: 32.7 g/dL (ref 32.0–36.0)
MCV: 90.4 fL (ref 80.0–100.0)
MPV: 10.2 fL (ref 7.5–12.5)
Platelets: 318 Thousand/uL (ref 140–400)
RBC: 4.06 Million/uL (ref 3.80–5.10)
RDW: 11.8 % (ref 11.0–15.0)
WBC: 9.8 Thousand/uL (ref 3.8–10.8)

## 2024-01-29 LAB — LIPASE: Lipase: 17 U/L (ref 7–60)

## 2024-01-29 LAB — URINE CULTURE
MICRO NUMBER:: 16806439
SPECIMEN QUALITY:: ADEQUATE

## 2024-01-31 ENCOUNTER — Encounter: Payer: Self-pay | Admitting: Nurse Practitioner

## 2024-02-01 ENCOUNTER — Ambulatory Visit: Payer: Self-pay | Admitting: Nurse Practitioner

## 2024-03-14 ENCOUNTER — Ambulatory Visit: Admitting: Family Medicine

## 2024-03-14 DIAGNOSIS — Z113 Encounter for screening for infections with a predominantly sexual mode of transmission: Secondary | ICD-10-CM

## 2024-03-14 LAB — WET PREP FOR TRICH, YEAST, CLUE
Clue Cell Exam: NEGATIVE
Trichomonas Exam: NEGATIVE
Yeast Exam: NEGATIVE

## 2024-03-14 NOTE — Progress Notes (Signed)
 Pt is here for STD screening. Wet prep results reviewed with patient  and requires no treatment per standing orders. Condoms given. Kwadwo Canaan Prue,RN.

## 2024-03-15 ENCOUNTER — Ambulatory Visit: Admitting: Nurse Practitioner

## 2024-03-21 NOTE — Progress Notes (Signed)
 [facility] Department STI clinic [address] [place] [address] Main phone: [phone]  STI screening visit  Subjective:  Annette White is a 28 y.o. female being seen today for an STI screening visit. The patient reports they do not have symptoms.  Patient reports that they do not desire a pregnancy in the next year.   They reported they are not interested in discussing contraception today.    No LMP recorded.  Patient has the following medical conditions:  Patient Active Problem List   Diagnosis Date Noted   Urinary frequency 01/28/2024   Atypical chest pain 01/28/2024   Epigastric pain 01/28/2024   Incomplete miscarriage 10/19/2023   Positive pregnancy test 09/21/23 09/26/2023   Abnormal urinalysis 01/04/2023   Vaginal discharge 01/04/2023   Bilateral low back pain without sciatica 01/04/2023   Encounter to establish care 01/04/2023   Trichomonas infection 03/31/22 03/31/2022   Left pyosalpinx 12/19/2021   Obesity BMI 30.4 07/21/2021    history of STDs (trich, Chlamydia, GC, PID) 07/21/2021   Smoker 07/21/2021   Vapes nicotine containing substance 07/21/2021   Nausea 01/23/2018   Eczema 07/17/2014   Allergic rhinitis due to allergen 07/17/2014   Chief Complaint  Patient presents with   SEXUALLY TRANSMITTED DISEASE    Pt is here STD screening and report having symptoms   HPI Patient reports desire for STI screening. Some vaginal discomfort.   See flowsheet for further details and programmatic requirements Hyperlink available at the top of the signed note in blue.  Flow sheet content below:  Pregnancy Intention Screening Does the patient want to become pregnant in the next year?: No Does the patient's partner want to become pregnant in the next year?: No Would the patient like to discuss contraceptive options today?: No Counseling Patient counseled to use condoms with all sex: Condoms given Clinic will call if test  results abnormal before test result appt.: Yes Test results given to patient Patient counseled to use condoms with all sex: Condoms given   Screening for MPX risk: Does the patient have an unexplained rash? No Is the patient MSM? No Does the patient endorse multiple sex partners or anonymous sex partners? No Did the patient have close or sexual contact with a person diagnosed with MPX? No Has the patient traveled outside the [place]  where MPX is endemic? No Is there a high clinical suspicion for MPX-- evidenced by one of the following No  -Unlikely to be chickenpox  -Lymphadenopathy  -Rash that present in same phase of evolution on any given body part  Screenings: Last HIV test per patient/review of record was  Lab Results  Component Value Date   HMHIVSCREEN Negative - Validated 09/21/2023    Lab Results  Component Value Date   HIV NON REACTIVE 02/25/2015    Last HEPC test per patient/review of record was  Lab Results  Component Value Date   HMHEPCSCREEN Negative-Validated 09/21/2023   No components found for: HEPC   Last HEPB test per patient/review of record was No components found for: HMHEPBSCREEN   Patient reports last pap was:   Lab Results  Component Value Date   SPECADGYN Comment 07/21/2021   Result Date Procedure Results Follow-ups  07/21/2021 IGP, rfx Aptima HPV ASCU DIAGNOSIS:: Comment Specimen adequacy:: Comment Clinician Provided ICD10: Comment Performed by:: Comment PAP Smear Comment: . Note:: Comment Test Methodology: Comment PAP Reflex: Comment    Immunization history:  Immunization History  Administered Date(s) Administered   HPV Quadrivalent  01/05/2007, 03/10/2007, 08/08/2007   Hepatitis A 08/08/2007   Hepatitis B 07/27/1994   MMR 01/09/1999   Tdap 07/14/2013   Varicella 08/08/2007    The following portions of the patient's history were reviewed and updated as appropriate: allergies, current medications, past medical history, past social  history, past surgical history and problem list.  Objective:  There were no vitals filed for this visit.  Physical Exam Vitals and nursing note reviewed.  Constitutional:      Appearance: Normal appearance.  HENT:     Head: Normocephalic.     Mouth/Throat:     Mouth: Mucous membranes are moist.  Cardiovascular:     Rate and Rhythm: Normal rate.  Pulmonary:     Effort: Pulmonary effort is normal.  Abdominal:     Palpations: Abdomen is soft.  Genitourinary:    Comments: Declined genital exam- no symptoms, self swabbed Musculoskeletal:        General: Normal range of motion.  Lymphadenopathy:     Head:     Right side of head: No submandibular, preauricular or posterior auricular adenopathy.     Left side of head: No submandibular, preauricular or posterior auricular adenopathy.     Cervical: No cervical adenopathy.     Upper Body:     Right upper body: No supraclavicular or axillary adenopathy.     Left upper body: No supraclavicular or axillary adenopathy.  Skin:    General: Skin is warm and dry.  Neurological:     Mental Status: She is alert and oriented to person, place, and time.  Psychiatric:        Mood and Affect: Mood normal.    Assessment and Plan:  Annette White is a 28 y.o. female presenting to the [facility] Department for STI screening  Screening examination for venereal disease -     WET PREP FOR TRICH, YEAST, CLUE  Patient accepted the following screenings: vaginal wet prep Patient meets criteria for HepB screening? No. Ordered? not applicable Patient meets criteria for HepC screening? No. Ordered? not applicable  Treat wet prep per standing order Discussed time line for State Lab results and that patient will be called with positive results and encouraged patient to call if she had not heard in 2 weeks.  Counseled to return or seek care for continued or worsening symptoms Recommended repeat testing in 3 months with positive  results. Recommended condom use with all sex for STI prevention.   Patient is currently using OCPs to prevent pregnancy.    No follow-ups on file.  Future Appointments  Date Time Provider Department Center  04/20/2024  3:40 PM Wendee Lynwood HERO, NP [facility] [address] [place], [place]

## 2024-03-30 NOTE — Addendum Note (Signed)
 Addended by: Liala Codispoti M on: 03/30/2024 05:15 PM   Modules accepted: Orders

## 2024-04-20 ENCOUNTER — Ambulatory Visit: Admitting: Nurse Practitioner

## 2024-05-29 ENCOUNTER — Other Ambulatory Visit: Payer: Self-pay

## 2024-05-29 ENCOUNTER — Emergency Department

## 2024-05-29 ENCOUNTER — Other Ambulatory Visit: Payer: Self-pay | Admitting: Advanced Practice Midwife

## 2024-05-29 ENCOUNTER — Emergency Department
Admission: EM | Admit: 2024-05-29 | Discharge: 2024-05-29 | Disposition: A | Discharge status: Home / Self Care | Attending: Emergency Medicine | Admitting: Emergency Medicine

## 2024-05-29 DIAGNOSIS — O3680X0 Pregnancy with inconclusive fetal viability, not applicable or unspecified: Secondary | ICD-10-CM

## 2024-05-29 DIAGNOSIS — O4690 Antepartum hemorrhage, unspecified, unspecified trimester: Secondary | ICD-10-CM

## 2024-05-29 LAB — URINALYSIS, ROUTINE W REFLEX MICROSCOPIC
Bacteria, UA: NONE SEEN
Bilirubin Urine: NEGATIVE
Glucose, UA: NEGATIVE mg/dL
Ketones, ur: NEGATIVE mg/dL
Leukocytes,Ua: NEGATIVE
Nitrite: NEGATIVE
Protein, ur: NEGATIVE mg/dL
Specific Gravity, Urine: 1.005 (ref 1.005–1.030)
pH: 7 (ref 5.0–8.0)

## 2024-05-29 LAB — CBC WITH DIFFERENTIAL/PLATELET
Abs Immature Granulocytes: 0.03 K/uL (ref 0.00–0.07)
Basophils Absolute: 0 K/uL (ref 0.0–0.1)
Basophils Relative: 0 %
Eosinophils Absolute: 0.2 K/uL (ref 0.0–0.5)
Eosinophils Relative: 2 %
HCT: 35 % — ABNORMAL LOW (ref 36.0–46.0)
Hemoglobin: 11.8 g/dL — ABNORMAL LOW (ref 12.0–15.0)
Immature Granulocytes: 0 %
Lymphocytes Relative: 29 %
Lymphs Abs: 2.9 K/uL (ref 0.7–4.0)
MCH: 29.7 pg (ref 26.0–34.0)
MCHC: 33.7 g/dL (ref 30.0–36.0)
MCV: 88.2 fL (ref 80.0–100.0)
Monocytes Absolute: 0.5 K/uL (ref 0.1–1.0)
Monocytes Relative: 5 %
Neutro Abs: 6.5 K/uL (ref 1.7–7.7)
Neutrophils Relative %: 64 %
Platelets: 307 K/uL (ref 150–400)
RBC: 3.97 MIL/uL (ref 3.87–5.11)
RDW: 12.7 % (ref 11.5–15.5)
WBC: 10.1 K/uL (ref 4.0–10.5)
nRBC: 0 % (ref 0.0–0.2)

## 2024-05-29 LAB — POC URINE PREG, ED: Preg Test, Ur: POSITIVE — AB

## 2024-05-29 LAB — HCG, QUANTITATIVE, PREGNANCY: hCG, Beta Chain, Quant, S: 1064 m[IU]/mL — ABNORMAL HIGH (ref <5)

## 2024-05-29 NOTE — ED Provider Notes (Signed)
 [facility] Provider Note    Event Date/Time   First MD Initiated Contact with Patient 05/29/24 1608     (approximate)   History   Vaginal Bleeding   HPI  Annette White is a 28 y.o. female with no active medical problems who presents with vaginal bleeding, acute onset 2 days ago, described as mild bleeding, only present when she wipes.  She has not had to use a pad or tampon.  It is associated with left lower back pain.  The symptoms improved yesterday but then reoccurred today.  The patient believes that she is about [redacted] weeks pregnant.  She denies any nausea or vomiting.  She has no abnormal vaginal discharge.  She has no fever or chills.  I reviewed the past medical records.  The patient's most recent outpatient encounter was with family medicine on 9/23 for STI screening.   Physical Exam   Triage Vital Signs: ED Triage Vitals  Encounter Vitals Group     BP 05/29/24 1434 120/75     Girls Systolic BP Percentile --      Girls Diastolic BP Percentile --      Boys Systolic BP Percentile --      Boys Diastolic BP Percentile --      Pulse Rate 05/29/24 1434 98     Resp 05/29/24 1434 18     Temp 05/29/24 1434 98 F (36.7 C)     Temp src --      SpO2 05/29/24 1434 100 %     Weight 05/29/24 1433 173 lb (78.5 kg)     Height 05/29/24 1433 5' 1 (1.549 m)     Head Circumference --      Peak Flow --      Pain Score 05/29/24 1433 7     Pain Loc --      Pain Education --      Exclude from Growth Chart --     Most recent vital signs: Vitals:   05/29/24 1615 05/29/24 1846  BP:  117/74  Pulse:  90  Resp:  18  Temp:  98.4 F (36.9 C)  SpO2: 100% 100%     General: Awake, no distress.  CV:  Good peripheral perfusion.  Resp:  Normal effort.  Abd:  Soft and nontender.  No distention.  Other:  No jaundice or scleral icterus.   ED Results / Procedures / Treatments   Labs (all labs ordered are listed, but only abnormal results are  displayed) Labs Reviewed  CBC WITH DIFFERENTIAL/PLATELET - Abnormal; Notable for the following components:      Result Value   Hemoglobin 11.8 (*)    HCT 35.0 (*)    All other components within normal limits  URINALYSIS, ROUTINE W REFLEX MICROSCOPIC - Abnormal; Notable for the following components:   Color, Urine STRAW (*)    APPearance CLEAR (*)    Hgb urine dipstick SMALL (*)    All other components within normal limits  HCG, QUANTITATIVE, PREGNANCY - Abnormal; Notable for the following components:   hCG, Beta Chain, Quant, S 1,064 (*)    All other components within normal limits  POC URINE PREG, ED - Abnormal; Notable for the following components:   Preg Test, Ur Positive (*)    All other components within normal limits     EKG    RADIOLOGY  US  OB: I independently viewed and interpreted the images; there is no visible IUP.  Radiology report indicates the  following:  IMPRESSION:  1. No evidence of intrauterine pregnancy on this exam. Serial beta  HCG measurements and follow-up ultrasound recommended to document  intrauterine pregnancy and exclude ectopic pregnancy.  2. Suspected 2.1 cm corpus luteal cyst within the right ovary. This  could be reassessed at follow-up.  3. Trace pelvic free fluid.    PROCEDURES:  Critical Care performed: No  Procedures   MEDICATIONS ORDERED IN ED: Medications - No data to display   IMPRESSION / MDM / ASSESSMENT AND PLAN / ED COURSE  I reviewed the triage vital signs and the nursing notes.  28 year old female with PMH as noted above, around [redacted] weeks pregnant by dates, presents with mild vaginal bleeding along with some left lower back pain.  She has no abdominal pain.  On exam she is well-appearing.  Vital signs are normal.  Abdomen is nontender.  Differential diagnosis includes, but is not limited to, threatened miscarriage, complete miscarriage, subchorionic hemorrhage, ectopic pregnancy.  CBC shows a near normal hemoglobin.   Urinalysis shows no evidence of UTI.  hCG is 1064.  We will obtain an ultrasound for further evaluation.  Patient's presentation is most consistent with acute complicated illness / injury requiring diagnostic workup.  ----------------------------------------- 7:10 PM on 05/29/2024 -----------------------------------------  Ultrasound shows no IUP.  Since hCG is still just 1000, this could either be early pregnancy, miscarriage, or less likely ectopic.  At this time the patient has no clinical findings to suggest ectopic pregnancy.  She has not required any pain medication in the ED and appears comfortable.  She will need close outpatient follow-up.  I consulted CNM Gledhill from [facility] about the patient.  She will help arrange for outpatient follow-up.  I counseled the patient on the results of the workup and plan of care.  I gave strict return precautions and answered all of her questions; she expressed understanding and agreement.   FINAL CLINICAL IMPRESSION(S) / ED DIAGNOSES   Final diagnoses:  Vaginal bleeding in pregnancy  Pregnancy of unknown anatomic location     Rx / DC Orders   ED Discharge Orders     None        Note:  This document was prepared using Dragon voice recognition software and may include unintentional dictation errors.    Jacolyn Pae, MD 05/29/24 1911

## 2024-05-29 NOTE — ED Notes (Signed)
 Pelvic cart placed at bedside. Pt states only spotting a little while using the bathroom.

## 2024-05-29 NOTE — Discharge Instructions (Signed)
 Your ultrasound shows no pregnancy in the uterus.  This can either be because it is too early, or you may have had a very early miscarriage, or you could have an ectopic pregnancy (where the pregnancy is not in the uterus).  This can be an emergency.  Call to follow-up at [facility] within the next week.  In the meantime, return to the ER immediately for new, worsening, or persistent severe bleeding, abdominal pain, pain, dizziness or lightheadedness, or any other new or worsening symptoms that concern you.

## 2024-05-29 NOTE — Progress Notes (Signed)
 Orders placed for serial beta hcg and follow up ultrasound. Patient seen in ER today for vaginal bleeding. Beta level 1,064. IUP not seen on today's ultrasound.

## 2024-05-29 NOTE — ED Triage Notes (Signed)
 Pt comes with c/o vaginal bleeding and back pain that started on Saturday. Pt states nothing on Sunday an then more today. Pt states she is about [redacted] weeks pregnant.

## 2024-05-30 ENCOUNTER — Telehealth: Payer: Self-pay | Admitting: Advanced Practice Midwife

## 2024-05-30 NOTE — Telephone Encounter (Signed)
 Pt went to ER for bleeding.  Reached out to pt to schedule serial betas for Wednesday, 12/10 and Friday, 12/12.  Also needs a repeat transvaginal US  next.  Val has approved a transvaginal US  on 06/05/2024 at 10:45.  Could not leave a message bc mailbox was full.  Will send a MyChart message to pt.

## 2024-05-31 ENCOUNTER — Other Ambulatory Visit

## 2024-05-31 DIAGNOSIS — O3680X0 Pregnancy with inconclusive fetal viability, not applicable or unspecified: Secondary | ICD-10-CM

## 2024-05-31 NOTE — Telephone Encounter (Signed)
 Reached out to pt (2x) to schedule lab appts and a transvaginal US .  Could not leave a message bc mailbox was full.

## 2024-06-01 LAB — BETA HCG QUANT (REF LAB): hCG Quant: 2241 m[IU]/mL

## 2024-06-02 ENCOUNTER — Other Ambulatory Visit

## 2024-06-02 DIAGNOSIS — O3680X0 Pregnancy with inconclusive fetal viability, not applicable or unspecified: Secondary | ICD-10-CM

## 2024-06-02 NOTE — Telephone Encounter (Signed)
 Pt has been scheduled for the needed appts.

## 2024-06-03 LAB — BETA HCG QUANT (REF LAB): hCG Quant: 4179 m[IU]/mL

## 2024-06-05 ENCOUNTER — Ambulatory Visit
Admission: RE | Admit: 2024-06-05 | Discharge: 2024-06-05 | Attending: Advanced Practice Midwife | Admitting: Advanced Practice Midwife

## 2024-06-05 DIAGNOSIS — O3680X0 Pregnancy with inconclusive fetal viability, not applicable or unspecified: Secondary | ICD-10-CM

## 2024-06-09 ENCOUNTER — Emergency Department: Admitting: Anesthesiology

## 2024-06-09 ENCOUNTER — Emergency Department

## 2024-06-09 ENCOUNTER — Encounter: Admission: EM | Disposition: A | Payer: Self-pay | Source: Home / Self Care | Attending: Emergency Medicine

## 2024-06-09 ENCOUNTER — Other Ambulatory Visit: Payer: Self-pay

## 2024-06-09 ENCOUNTER — Observation Stay
Admission: EM | Admit: 2024-06-09 | Discharge: 2024-06-10 | Disposition: A | Discharge status: Home / Self Care | Attending: Obstetrics and Gynecology | Admitting: Obstetrics and Gynecology

## 2024-06-09 DIAGNOSIS — R109 Unspecified abdominal pain: Secondary | ICD-10-CM | POA: Diagnosis present

## 2024-06-09 DIAGNOSIS — O009 Unspecified ectopic pregnancy without intrauterine pregnancy: Principal | ICD-10-CM

## 2024-06-09 DIAGNOSIS — O00102 Left tubal pregnancy without intrauterine pregnancy: Principal | ICD-10-CM | POA: Diagnosis present

## 2024-06-09 HISTORY — PX: DIAGNOSTIC LAPAROSCOPY WITH REMOVAL OF ECTOPIC PREGNANCY: SHX6449

## 2024-06-09 LAB — COMPREHENSIVE METABOLIC PANEL WITH GFR
ALT: 9 U/L (ref 0–44)
AST: 12 U/L — ABNORMAL LOW (ref 15–41)
Albumin: 3.6 g/dL (ref 3.5–5.0)
Alkaline Phosphatase: 37 U/L — ABNORMAL LOW (ref 38–126)
Anion gap: 13 (ref 5–15)
BUN: 14 mg/dL (ref 6–20)
CO2: 21 mmol/L — ABNORMAL LOW (ref 22–32)
Calcium: 7.7 mg/dL — ABNORMAL LOW (ref 8.9–10.3)
Chloride: 106 mmol/L (ref 98–111)
Creatinine, Ser: 0.62 mg/dL (ref 0.44–1.00)
GFR, Estimated: >60 mL/min
Glucose, Bld: 143 mg/dL — ABNORMAL HIGH (ref 70–99)
Potassium: 3.2 mmol/L — ABNORMAL LOW (ref 3.5–5.1)
Sodium: 140 mmol/L (ref 135–145)
Total Bilirubin: <0.2 mg/dL (ref 0.0–1.2)
Total Protein: 5.4 g/dL — ABNORMAL LOW (ref 6.5–8.1)

## 2024-06-09 LAB — CBC
HCT: 27.5 % — ABNORMAL LOW (ref 36.0–46.0)
Hemoglobin: 8.9 g/dL — ABNORMAL LOW (ref 12.0–15.0)
MCH: 29.3 pg (ref 26.0–34.0)
MCHC: 32.4 g/dL (ref 30.0–36.0)
MCV: 90.5 fL (ref 80.0–100.0)
Platelets: 286 K/uL (ref 150–400)
RBC: 3.04 MIL/uL — ABNORMAL LOW (ref 3.87–5.11)
RDW: 12.7 % (ref 11.5–15.5)
WBC: 10.9 K/uL — ABNORMAL HIGH (ref 4.0–10.5)
nRBC: 0 % (ref 0.0–0.2)

## 2024-06-09 LAB — TROPONIN T, HIGH SENSITIVITY: Troponin T High Sensitivity: <15 ng/L (ref 0–19)

## 2024-06-09 LAB — LIPASE, BLOOD: Lipase: 22 U/L (ref 11–51)

## 2024-06-09 LAB — HCG, QUANTITATIVE, PREGNANCY: hCG, Beta Chain, Quant, S: 8246 m[IU]/mL — ABNORMAL HIGH

## 2024-06-09 SURGERY — LAPAROSCOPY, WITH ECTOPIC PREGNANCY SURGICAL TREATMENT
Anesthesia: General

## 2024-06-09 MED ORDER — SODIUM CHLORIDE 0.9% IV SOLUTION: Freq: Once | INTRAVENOUS | Status: DC

## 2024-06-09 MED ORDER — MIDAZOLAM HCL 2 MG/2ML IJ SOLN
INTRAMUSCULAR | Status: AC
  Filled 2024-06-09: qty 2

## 2024-06-09 MED ORDER — ETOMIDATE 2 MG/ML IV SOLN
INTRAVENOUS | Status: DC | PRN
  Administered 2024-06-09: 20 mg via INTRAVENOUS

## 2024-06-09 MED ORDER — ROCURONIUM BROMIDE 100 MG/10ML IV SOLN
INTRAVENOUS | Status: DC | PRN
  Administered 2024-06-09: 30 mg via INTRAVENOUS
  Administered 2024-06-09 (×2): 20 mg via INTRAVENOUS

## 2024-06-09 MED ORDER — MIDAZOLAM HCL (PF) 2 MG/2ML IJ SOLN
INTRAMUSCULAR | Status: DC | PRN
  Administered 2024-06-09: 2 mg via INTRAVENOUS

## 2024-06-09 MED ORDER — LIDOCAINE HCL 1 % IJ SOLN
INTRAMUSCULAR | Status: DC | PRN
  Administered 2024-06-09: 30 mL

## 2024-06-09 MED ORDER — DEXMEDETOMIDINE HCL IN NACL 80 MCG/20ML IV SOLN
INTRAVENOUS | Status: DC | PRN
  Administered 2024-06-09: 8 ug via INTRAVENOUS

## 2024-06-09 MED ORDER — VASOPRESSIN 20 UNIT/ML IV SOLN
INTRAVENOUS | Status: DC | PRN
  Administered 2024-06-09 (×3): 2 [IU] via INTRAVENOUS

## 2024-06-09 MED ORDER — DEXAMETHASONE SOD PHOSPHATE PF 10 MG/ML IJ SOLN
INTRAMUSCULAR | Status: DC | PRN
  Administered 2024-06-09: 10 mg via INTRAVENOUS

## 2024-06-09 MED ORDER — ALBUMIN HUMAN 5 % IV SOLN: INTRAVENOUS | Status: DC | PRN

## 2024-06-09 MED ORDER — FENTANYL CITRATE (PF) 100 MCG/2ML IJ SOLN
INTRAMUSCULAR | Status: DC | PRN
  Administered 2024-06-09 (×2): 25 ug via INTRAVENOUS
  Administered 2024-06-09: 50 ug via INTRAVENOUS

## 2024-06-09 MED ORDER — ALBUMIN HUMAN 5 % IV SOLN
INTRAVENOUS | Status: AC
  Filled 2024-06-09: qty 500

## 2024-06-09 MED ORDER — LACTATED RINGERS IV SOLN: INTRAVENOUS | Status: DC | PRN

## 2024-06-09 MED ORDER — PHENYLEPHRINE 80 MCG/ML (10ML) SYRINGE FOR IV PUSH (FOR BLOOD PRESSURE SUPPORT)
PREFILLED_SYRINGE | INTRAVENOUS | Status: DC | PRN
  Administered 2024-06-09 (×4): 160 ug via INTRAVENOUS

## 2024-06-09 MED ORDER — FENTANYL CITRATE (PF) 100 MCG/2ML IJ SOLN
INTRAMUSCULAR | Status: AC
  Filled 2024-06-09: qty 2

## 2024-06-09 MED ORDER — SODIUM CHLORIDE 0.9 % IV SOLN: INTRAVENOUS | Status: DC | PRN

## 2024-06-09 MED ORDER — ONDANSETRON HCL 4 MG/2ML IJ SOLN
INTRAMUSCULAR | Status: DC | PRN
  Administered 2024-06-09: 4 mg via INTRAVENOUS

## 2024-06-09 MED ORDER — SODIUM CHLORIDE 0.9 % IR SOLN
Status: DC | PRN
  Administered 2024-06-09: 200 mL

## 2024-06-09 MED ORDER — SUCCINYLCHOLINE CHLORIDE 200 MG/10ML IV SOSY
PREFILLED_SYRINGE | INTRAVENOUS | Status: DC | PRN
  Administered 2024-06-09: 170 mg via INTRAVENOUS

## 2024-06-09 MED ORDER — LIDOCAINE HCL (PF) 1 % IJ SOLN
INTRAMUSCULAR | Status: AC
  Filled 2024-06-09: qty 30

## 2024-06-09 SURGICAL SUPPLY — 41 items
BAG URINE DRAIN 2000ML AR STRL (UROLOGICAL SUPPLIES) ×1 IMPLANT
BENZOIN TINCTURE PRP APPL 2/3 (GAUZE/BANDAGES/DRESSINGS) IMPLANT
BLADE SURG SZ11 CARB STEEL (BLADE) ×1 IMPLANT
CATH ROBINSON RED A/P 16FR (CATHETERS) ×1 IMPLANT
CATH URTH 16FR FL 2W BLN LF (CATHETERS) IMPLANT
CHLORAPREP W/TINT 26 (MISCELLANEOUS) ×1 IMPLANT
DEFOGGER SCOPE WARM SEASHARP (MISCELLANEOUS) IMPLANT
DERMABOND ADVANCED .7 DNX12 (GAUZE/BANDAGES/DRESSINGS) IMPLANT
DRSG TEGADERM 2-3/8X2-3/4 SM (GAUZE/BANDAGES/DRESSINGS) ×3 IMPLANT
GAUZE 4X4 16PLY ~~LOC~~+RFID DBL (SPONGE) ×1 IMPLANT
GAUZE SPONGE 2X2 STRL 8-PLY (GAUZE/BANDAGES/DRESSINGS) ×3 IMPLANT
GLOVE SURG SYN 8.0 PF PI (GLOVE) ×2 IMPLANT
GOWN STRL REUS W/ TWL LRG LVL3 (GOWN DISPOSABLE) ×2 IMPLANT
GOWN STRL REUS W/ TWL XL LVL3 (GOWN DISPOSABLE) ×2 IMPLANT
GRASPER SUT TROCAR 14GX15 (MISCELLANEOUS) ×1 IMPLANT
IRRIGATION STRYKERFLOW (MISCELLANEOUS) IMPLANT
IV 0.9% NACL 1000 ML (IV SOLUTION) ×1 IMPLANT
KIT PINK PAD W/HEAD ARM REST (MISCELLANEOUS) ×1 IMPLANT
LABEL OR SOLS (LABEL) ×1 IMPLANT
LIGASURE VESSEL 5MM BLUNT TIP (ELECTROSURGICAL) IMPLANT
MANIFOLD NEPTUNE II (INSTRUMENTS) ×1 IMPLANT
NS IRRIG 500ML POUR BTL (IV SOLUTION) ×1 IMPLANT
PACK GYN LAPAROSCOPIC (MISCELLANEOUS) ×1 IMPLANT
PAD OB MATERNITY 11 LF (PERSONAL CARE ITEMS) ×1 IMPLANT
PAD PREP OB/GYN DISP 24X41 (PERSONAL CARE ITEMS) ×1 IMPLANT
SCRUB CHG 4% DYNA-HEX 4OZ (MISCELLANEOUS) ×1 IMPLANT
SET TUBE SMOKE EVAC HIGH FLOW (TUBING) ×1 IMPLANT
SHEARS HARMONIC 36 ACE (MISCELLANEOUS) IMPLANT
SLEEVE SCD COMPRESS KNEE LRG (STOCKING) IMPLANT
SLEEVE Z-THREAD 5X100MM (TROCAR) ×1 IMPLANT
SOLN STERILE WATER 500 ML (IV SOLUTION) ×1 IMPLANT
STRIP CLOSURE SKIN 1/4X4 (GAUZE/BANDAGES/DRESSINGS) IMPLANT
SUT VIC AB 0 CT1 36 (SUTURE) ×1 IMPLANT
SUT VIC AB 2-0 UR6 27 (SUTURE) ×1 IMPLANT
SUT VIC AB 4-0 SH 27XANBCTRL (SUTURE) ×1 IMPLANT
SUT VICRYL 0 UR6 27IN ABS (SUTURE) IMPLANT
SYR BULB IRRIG 60ML STRL (SYRINGE) IMPLANT
SYSTEM BAG RETRIEVAL 10MM (BASKET) IMPLANT
TRAP FLUID SMOKE EVACUATOR (MISCELLANEOUS) ×1 IMPLANT
TROCAR Z-THRD FIOS HNDL 11X100 (TROCAR) IMPLANT
TROCAR Z-THREAD FIOS 5X100MM (TROCAR) ×1 IMPLANT

## 2024-06-09 NOTE — ED Notes (Signed)
 OB at bedside,  consent for surgery obtained.

## 2024-06-09 NOTE — ED Triage Notes (Signed)
 Patient coming [facility] from [place] in a friends car. Abdominal pain radiating to groin. CBG 133 for EMS. EMS gave 4mg  zofran  and approx , started 20g IV left AC . EMS vitals: 118/74, HR 90, NaCl given in transport. Some emesis during transport. 100% on RA.   Patient now also c/o chest pain and SOB.   Patient reported pregnancy to EMS, was seen in ED for bleeding during pregnancy on 12/8.

## 2024-06-09 NOTE — ED Provider Notes (Signed)
 "  [facility] Provider Note    Event Date/Time   First MD Initiated Contact with Patient 06/09/24 2004     (approximate)   History   Abdominal Pain   HPI  Annette White is a 28 y.o. female who comes in with concerns for abdominal pain.  Patient reports being followed for possible pregnancy.  She had ultrasound done on 12/8 with no intrauterine pregnancy recommended following up with serial hCGs.  She had another ultrasound done on 06/05/2024 that was concerning for intrauterine gestational sac but no yolk sac was visualized.  She comes in today with significant pain in her abdomen and feeling pale.  She denies any worsening vaginal bleeding just pain.  There was some concern for patient having some shortness of breath and chest pain on route the patient was given 4 of Zofran .  Physical Exam   Triage Vital Signs: ED Triage Vitals  Encounter Vitals Group     BP 06/09/24 1754 (!) 104/54     Girls Systolic BP Percentile --      Girls Diastolic BP Percentile --      Boys Systolic BP Percentile --      Boys Diastolic BP Percentile --      Pulse Rate 06/09/24 1754 73     Resp 06/09/24 1754 19     Temp 06/09/24 1754 98 F (36.7 C)     Temp src --      SpO2 06/09/24 1754 100 %     Weight 06/09/24 1748 171 lb 15.3 oz (78 kg)     Height 06/09/24 1748 5' 1 (1.549 m)     Head Circumference --      Peak Flow --      Pain Score 06/09/24 1748 10     Pain Loc --      Pain Education --      Exclude from Growth Chart --     Most recent vital signs: Vitals:   06/09/24 1754  BP: (!) 104/54  Pulse: 73  Resp: 19  Temp: 98 F (36.7 C)  SpO2: 100%     General: Awake, no distress.  CV:  Good peripheral perfusion.  Resp:  Normal effort.  Abd:  No distention.  Tenderness throughout the abdomen Other:     ED Results / Procedures / Treatments   Labs (all labs ordered are listed, but only abnormal results are displayed) Labs Reviewed   COMPREHENSIVE METABOLIC PANEL WITH GFR - Abnormal; Notable for the following components:      Result Value   Potassium 3.2 (*)    CO2 21 (*)    Glucose, Bld 143 (*)    Calcium 7.7 (*)    Total Protein 5.4 (*)    AST 12 (*)    Alkaline Phosphatase 37 (*)    All other components within normal limits  CBC - Abnormal; Notable for the following components:   WBC 10.9 (*)    RBC 3.04 (*)    Hemoglobin 8.9 (*)    HCT 27.5 (*)    All other components within normal limits  LIPASE, BLOOD  URINALYSIS, ROUTINE W REFLEX MICROSCOPIC  HCG, QUANTITATIVE, PREGNANCY  POC URINE PREG, ED  TROPONIN T, HIGH SENSITIVITY  TROPONIN T, HIGH SENSITIVITY     EKG  My interpretation of EKG:  Normal sinus rate of 63, no st elevation, no twi, normal intervals  PROCEDURES:  Critical Care performed: Yes, see critical care procedure note(s)  .1-3 Lead  EKG Interpretation  Performed by: Ernest Ronal BRAVO, MD Authorized by: Ernest Ronal BRAVO, MD     Interpretation: abnormal     ECG rate:  110   ECG rate assessment: tachycardic     Rhythm: sinus tachycardia     Ectopy: none     Conduction: normal   .Critical Care  Performed by: Ernest Ronal BRAVO, MD Authorized by: Ernest Ronal BRAVO, MD   Critical care provider statement:    Critical care time (minutes):  30   Critical care was necessary to treat or prevent imminent or life-threatening deterioration of the following conditions: shock.   Critical care was time spent personally by me on the following activities:  Development of treatment plan with patient or surrogate, discussions with consultants, evaluation of patient's response to treatment, examination of patient, ordering and review of laboratory studies, ordering and review of radiographic studies, ordering and performing treatments and interventions, pulse oximetry, re-evaluation of patient's condition and review of old charts    MEDICATIONS ORDERED IN ED: Medications  0.9 %  sodium chloride  infusion  (Manually program via Guardrails IV Fluids) (has no administration in time range)  0.9 %  sodium chloride  infusion (Manually program via Guardrails IV Fluids) (has no administration in time range)     IMPRESSION / MDM / ASSESSMENT AND PLAN / ED COURSE  I reviewed the triage vital signs and the nursing notes.   Patient's presentation is most consistent with acute presentation with potential threat to life or bodily function.   Patient comes in with sudden onset of severe abdominal pain in the setting of of unknown pregnancy location.  Patient with significant pain in her abdomen patient is tachycardic hypotensive.  Bedside ultrasound done by myself concerning for free fluid therefore I immediately called OB/GYN Dr Rayma who is going to come in to evaluate patient.  While waiting for OB to come in I did order a formal bedside ultrasound in case they needed this from the [facility] perspective.    Given patient is hypotensive I will transfuse 2 unit of blood.  I want to try to keep her blood pressures with some permissive hypotension however hypotensive now into 60s..  She does have a significant drop in hemoglobin and she had denies any significant vaginal bleeding which is consistent with my concerns for intra-abdominal bleeding from ectopic pregnancy.  I suspect the chest pain, shortness of breath is related to anemia.  Her EKG was reassuring and troponin was negative.  I had ordered an x-ray while waiting for patient to go to the OR but this was deferred given the OR was ready.  She has no hypoxia and I am not concerned about a pneumothorax and I am concerned about a ruptured ectopic pregnancy   The patient is on the cardiac monitor to evaluate for evidence of arrhythmia and/or significant heart rate changes.      FINAL CLINICAL IMPRESSION(S) / ED DIAGNOSES   Final diagnoses:  Ruptured ectopic pregnancy     Rx / DC Orders   ED Discharge Orders     None        Note:  This document  was prepared using Dragon voice recognition software and may include unintentional dictation errors.   Ernest Ronal BRAVO, MD 06/09/24 2129  "

## 2024-06-09 NOTE — Anesthesia Preprocedure Evaluation (Signed)
"                                    Anesthesia Evaluation  Patient identified by MRN, date of birth, ID band Patient awake    Reviewed: Allergy & Precautions, NPO status , Patient's Chart, lab work & pertinent test results  History of Anesthesia Complications Negative for: history of anesthetic complications  Airway Mallampati: III  TM Distance: >3 FB Neck ROM: full    Dental  (+) Chipped   Pulmonary neg pulmonary ROS   Pulmonary exam normal        Cardiovascular negative cardio ROS Normal cardiovascular exam     Neuro/Psych negative neurological ROS  negative psych ROS   GI/Hepatic negative GI ROS, Neg liver ROS,,,  Endo/Other  negative endocrine ROS    Renal/GU      Musculoskeletal   Abdominal   Peds  Hematology  (+) Blood dyscrasia, anemia   Anesthesia Other Findings Past Medical History: 10/2023: Missed abortion No date: No pertinent past medical history  Past Surgical History: 11/08/2023: DILATION AND EVACUATION; N/A     Comment:  Procedure: DILATION AND EVACUATION, UTERUS;  Surgeon:               Leigh Sober, MD;  Location: [facility] ORS;  Service:               Gynecology;  Laterality: N/A;  D&C No date: NO PAST SURGERIES  BMI    Body Mass Index: 32.49 kg/m      Reproductive/Obstetrics negative OB ROS                              Anesthesia Physical Anesthesia Plan  ASA: 4 and emergent  Anesthesia Plan: General ETT and Rapid Sequence   Post-op Pain Management:    Induction: Intravenous  PONV Risk Score and Plan: Ondansetron , Dexamethasone , Midazolam  and Treatment may vary due to age or medical condition  Airway Management Planned: Oral ETT  Additional Equipment:   Intra-op Plan:   Post-operative Plan: Extubation in OR and Possible Post-op intubation/ventilation  Informed Consent: I have reviewed the patients History and Physical, chart, labs and discussed the procedure including the risks,  benefits and alternatives for the proposed anesthesia with the patient or authorized representative who has indicated his/her understanding and acceptance.     Dental Advisory Given  Plan Discussed with: Anesthesiologist, CRNA and Surgeon  Anesthesia Plan Comments: (Patient consented for risks of anesthesia including but not limited to:  - adverse reactions to medications - damage to eyes, teeth, lips or other oral mucosa - nerve damage due to positioning  - sore throat or hoarseness - Damage to heart, brain, nerves, lungs, other parts of body or loss of life  Patient voiced understanding and assent.)        Anesthesia Quick Evaluation  "

## 2024-06-09 NOTE — ED Notes (Addendum)
 On way to PACU. Unable to obtained oral temperature or axillary. Rectal deferred d/t pt being unstable and needing emergent transport to PACU.

## 2024-06-09 NOTE — Anesthesia Procedure Notes (Signed)
 Procedure Name: Intubation Date/Time: 06/09/2024 9:26 PM  Performed by: Dominica Krabbe, CRNAPre-anesthesia Checklist: Patient identified, Emergency Drugs available, Suction available, Patient being monitored and Timeout performed Patient Re-evaluated:Patient Re-evaluated prior to induction Oxygen Delivery Method: Circle system utilized Preoxygenation: Pre-oxygenation with 100% oxygen Induction Type: IV induction and Rapid sequence Laryngoscope Size: McGrath and 3 Grade View: Grade I Tube type: Oral Tube size: 7.5 mm Number of attempts: 1 Airway Equipment and Method: Stylet and Video-laryngoscopy Placement Confirmation: ETT inserted through vocal cords under direct vision, positive ETCO2 and breath sounds checked- equal and bilateral Secured at: 20 cm Tube secured with: Tape Dental Injury: Teeth and Oropharynx as per pre-operative assessment

## 2024-06-10 ENCOUNTER — Encounter: Payer: Self-pay | Admitting: Obstetrics and Gynecology

## 2024-06-10 ENCOUNTER — Other Ambulatory Visit: Payer: Self-pay | Admitting: Obstetrics and Gynecology

## 2024-06-10 ENCOUNTER — Observation Stay

## 2024-06-10 DIAGNOSIS — O00102 Left tubal pregnancy without intrauterine pregnancy: Secondary | ICD-10-CM

## 2024-06-10 HISTORY — DX: Left tubal pregnancy without intrauterine pregnancy: O00.102

## 2024-06-10 LAB — CBC WITH DIFFERENTIAL/PLATELET
Abs Immature Granulocytes: 0.16 K/uL — ABNORMAL HIGH (ref 0.00–0.07)
Basophils Absolute: 0 K/uL (ref 0.0–0.1)
Basophils Relative: 0 %
Eosinophils Absolute: 0 K/uL (ref 0.0–0.5)
Eosinophils Relative: 0 %
HCT: 25.2 % — ABNORMAL LOW (ref 36.0–46.0)
Hemoglobin: 8.8 g/dL — ABNORMAL LOW (ref 12.0–15.0)
Immature Granulocytes: 1 %
Lymphocytes Relative: 5 %
Lymphs Abs: 1.3 K/uL (ref 0.7–4.0)
MCH: 29.4 pg (ref 26.0–34.0)
MCHC: 34.9 g/dL (ref 30.0–36.0)
MCV: 84.3 fL (ref 80.0–100.0)
Monocytes Absolute: 0.3 K/uL (ref 0.1–1.0)
Monocytes Relative: 1 %
Neutro Abs: 22.4 K/uL — ABNORMAL HIGH (ref 1.7–7.7)
Neutrophils Relative %: 93 %
Platelets: 172 K/uL (ref 150–400)
RBC: 2.99 MIL/uL — ABNORMAL LOW (ref 3.87–5.11)
RDW: 14.1 % (ref 11.5–15.5)
WBC: 24.2 K/uL — ABNORMAL HIGH (ref 4.0–10.5)
nRBC: 0 % (ref 0.0–0.2)

## 2024-06-10 LAB — PREPARE RBC (CROSSMATCH)

## 2024-06-10 MED ORDER — DROPERIDOL 2.5 MG/ML IJ SOLN
INTRAMUSCULAR | Status: AC
  Filled 2024-06-10: qty 2

## 2024-06-10 MED ORDER — OXYCODONE HCL 5 MG PO TABS
5.0000 mg | ORAL_TABLET | Freq: Once | ORAL | Status: AC | PRN
  Administered 2024-06-10: 5 mg via ORAL

## 2024-06-10 MED ORDER — OXYCODONE HCL 5 MG PO TABS: 5.0000 mg | ORAL_TABLET | ORAL | 0 refills | Status: AC | PRN

## 2024-06-10 MED ORDER — ACETAMINOPHEN 10 MG/ML IV SOLN
INTRAVENOUS | Status: AC
  Filled 2024-06-10: qty 100

## 2024-06-10 MED ORDER — OXYCODONE HCL 5 MG/5ML PO SOLN: 5.0000 mg | Freq: Once | ORAL | Status: AC | PRN

## 2024-06-10 MED ORDER — OXYCODONE HCL 5 MG PO TABS
5.0000 mg | ORAL_TABLET | ORAL | Status: DC | PRN
  Administered 2024-06-10 (×2): 5 mg via ORAL
  Filled 2024-06-10 (×2): qty 1

## 2024-06-10 MED ORDER — FENTANYL CITRATE (PF) 100 MCG/2ML IJ SOLN
25.0000 ug | INTRAMUSCULAR | Status: DC | PRN
  Administered 2024-06-10 (×4): 50 ug via INTRAVENOUS

## 2024-06-10 MED ORDER — OXYCODONE HCL 5 MG PO TABS
ORAL_TABLET | ORAL | Status: AC
  Filled 2024-06-10: qty 1

## 2024-06-10 MED ORDER — SUGAMMADEX SODIUM 200 MG/2ML IV SOLN
INTRAVENOUS | Status: DC | PRN
  Administered 2024-06-10: 200 mg via INTRAVENOUS

## 2024-06-10 MED ORDER — ONDANSETRON 4 MG PO TBDP
ORAL_TABLET | ORAL | Status: AC
  Filled 2024-06-10: qty 1

## 2024-06-10 MED ORDER — DROPERIDOL 2.5 MG/ML IJ SOLN
0.6250 mg | Freq: Once | INTRAMUSCULAR | Status: AC
  Administered 2024-06-10: 0.625 mg via INTRAVENOUS

## 2024-06-10 MED ORDER — FENTANYL CITRATE (PF) 100 MCG/2ML IJ SOLN
INTRAMUSCULAR | Status: AC
  Filled 2024-06-10: qty 2

## 2024-06-10 MED ORDER — ONDANSETRON 4 MG PO TBDP
4.0000 mg | ORAL_TABLET | Freq: Once | ORAL | Status: AC
  Administered 2024-06-10: 4 mg via ORAL

## 2024-06-10 MED ORDER — HYDROMORPHONE HCL 1 MG/ML IJ SOLN
0.5000 mg | Freq: Four times a day (QID) | INTRAMUSCULAR | Status: DC | PRN
  Administered 2024-06-10: 0.5 mg via INTRAVENOUS
  Filled 2024-06-10: qty 0.5

## 2024-06-10 MED ORDER — ACETAMINOPHEN 10 MG/ML IV SOLN
1000.0000 mg | Freq: Once | INTRAVENOUS | Status: AC
  Administered 2024-06-10: 1000 mg via INTRAVENOUS

## 2024-06-10 MED ORDER — ACETAMINOPHEN 500 MG PO TABS: 1000.0000 mg | ORAL_TABLET | Freq: Four times a day (QID) | ORAL | 0 refills | Status: AC

## 2024-06-10 MED ORDER — SODIUM CHLORIDE 0.9 % IV SOLN: INTRAVENOUS | Status: DC

## 2024-06-10 MED ORDER — ACETAMINOPHEN 500 MG PO TABS
1000.0000 mg | ORAL_TABLET | Freq: Four times a day (QID) | ORAL | Status: DC
  Administered 2024-06-10: 1000 mg via ORAL
  Filled 2024-06-10 (×2): qty 2

## 2024-06-10 MED ORDER — IBUPROFEN 800 MG PO TABS: 800.0000 mg | ORAL_TABLET | Freq: Three times a day (TID) | ORAL | 0 refills | Status: AC | PRN

## 2024-06-10 NOTE — Anesthesia Postprocedure Evaluation (Signed)
"   Anesthesia Post Note  Patient: Annette White  Procedure(s) Performed: LAPAROSCOPY, WITH ECTOPIC PREGNANCY SURGICAL TREATMENT, left salpingectomy with removal of ectopic  Patient location during evaluation: PACU Anesthesia Type: General Level of consciousness: awake and alert Pain management: pain level controlled Vital Signs Assessment: post-procedure vital signs reviewed and stable Respiratory status: spontaneous breathing, nonlabored ventilation and respiratory function stable Cardiovascular status: blood pressure returned to baseline and stable Postop Assessment: no apparent nausea or vomiting Anesthetic complications: no   No notable events documented.   Last Vitals:  Vitals:   06/10/24 0229 06/10/24 0500  BP: (!) 99/57 (!) 92/52  Pulse: 95 80  Resp: 18 18  Temp: 36.7 C   SpO2: 99% 100%    Last Pain:  Vitals:   06/10/24 0614  TempSrc:   PainSc: 7                  Fairy POUR Darshana Curnutt      "

## 2024-06-10 NOTE — H&P (Addendum)
 Annette White is an 28 y.o. female. Patient states that she was having pain and presented to the ED (2) nights ago.  They were watching her QBHCG.  Her pain resolved and returned more intensely last night.  Pertinent Gynecological History: Menses: flow is light Bleeding: Normal Contraception: none DES exposure: unknown Blood transfusions: none Sexually transmitted diseases: no past history Previous GYN Procedures: DNC  OB History: G6, P4024   Menstrual History: Menarche age: 71 Patient's last menstrual period was 04/26/2024.    Past Medical History:  Diagnosis Date   Missed abortion 10/2023   No pertinent past medical history     Past Surgical History:  Procedure Laterality Date   DIAGNOSTIC LAPAROSCOPY WITH REMOVAL OF ECTOPIC PREGNANCY N/A 06/09/2024   Procedure: LAPAROSCOPY, WITH ECTOPIC PREGNANCY SURGICAL TREATMENT, left salpingectomy with removal of ectopic;  Surgeon: Davis Veva BRAVO, DO;  Location: [facility] ORS;  Service: Gynecology;  Laterality: N/A;   DILATION AND EVACUATION N/A 11/08/2023   Procedure: DILATION AND EVACUATION, UTERUS;  Surgeon: Leigh Sober, MD;  Location: [facility] ORS;  Service: Gynecology;  Laterality: N/A;  D&C   NO PAST SURGERIES      Family History  Problem Relation Age of Onset   Diabetes Mother    Hypertension Mother    Stroke Father    Hypertension Father    Diabetes Maternal Grandfather    Prostate cancer Maternal Grandfather    Cancer Paternal Grandmother        lung    Social History:  reports that she has never smoked. She has been exposed to tobacco smoke. She has never used smokeless tobacco. She reports current alcohol use. She reports current drug use. Drug: Marijuana.  Allergies: Allergies[1]  Medications Prior to Admission  Medication Sig Dispense Refill Last Dose/Taking   norethindrone-ethinyl estradiol-FE (JUNEL FE 1/20) 1-20 MG-MCG tablet Take 1 tablet by mouth daily. (Patient not taking: No sig reported) 84 tablet 4 Not  Taking   omeprazole  (PRILOSEC) 20 MG capsule Take 1 capsule (20 mg total) by mouth daily. (Patient not taking: Reported on 06/09/2024) 30 capsule 0 Not Taking    Review of Systems  Constitutional:  Positive for diaphoresis.  Respiratory:  Positive for shortness of breath.   Cardiovascular:  Positive for chest pain and palpitations.  Gastrointestinal:  Positive for abdominal distention and vomiting.  Genitourinary:  Positive for pelvic pain.  Skin:  Positive for pallor.  Neurological:  Positive for syncope, weakness and light-headedness.    Blood pressure (!) 92/52, pulse 80, temperature 98.1 F (36.7 C), temperature source Oral, resp. rate 18, height 5' 1 (1.549 m), weight 78 kg, last menstrual period 04/26/2024, SpO2 100%. Physical Exam Constitutional:      General: She is in acute distress.     Appearance: She is ill-appearing.  Cardiovascular:     Rate and Rhythm: Tachycardia present.  Pulmonary:     Breath sounds: Normal breath sounds.  Abdominal:     General: Bowel sounds are absent. There is distension.     Tenderness: There is abdominal tenderness in the right lower quadrant. There is guarding.  Genitourinary:    Uterus: Tender.      Adnexa:        Left: Tenderness and fullness present.   Skin:    Coloration: Skin is cyanotic.     Results for orders placed or performed during the hospital encounter of 06/09/24 (from the past 24 hours)  Lipase, blood     Status: None   Collection Time:  06/09/24  5:53 PM  Result Value Ref Range   Lipase 22 11 - 51 U/L  Comprehensive metabolic panel     Status: Abnormal   Collection Time: 06/09/24  5:53 PM  Result Value Ref Range   Sodium 140 135 - 145 mmol/L   Potassium 3.2 (L) 3.5 - 5.1 mmol/L   Chloride 106 98 - 111 mmol/L   CO2 21 (L) 22 - 32 mmol/L   Glucose, Bld 143 (H) 70 - 99 mg/dL   BUN 14 6 - 20 mg/dL   Creatinine, Ser 9.37 0.44 - 1.00 mg/dL   Calcium 7.7 (L) 8.9 - 10.3 mg/dL   Total Protein 5.4 (L) 6.5 - 8.1 g/dL    Albumin  3.6 3.5 - 5.0 g/dL   AST 12 (L) 15 - 41 U/L   ALT 9 0 - 44 U/L   Alkaline Phosphatase 37 (L) 38 - 126 U/L   Total Bilirubin <0.2 0.0 - 1.2 mg/dL   GFR, Estimated >39 >39 mL/min   Anion gap 13 5 - 15  CBC     Status: Abnormal   Collection Time: 06/09/24  5:53 PM  Result Value Ref Range   WBC 10.9 (H) 4.0 - 10.5 K/uL   RBC 3.04 (L) 3.87 - 5.11 MIL/uL   Hemoglobin 8.9 (L) 12.0 - 15.0 g/dL   HCT 72.4 (L) 63.9 - 53.9 %   MCV 90.5 80.0 - 100.0 fL   MCH 29.3 26.0 - 34.0 pg   MCHC 32.4 30.0 - 36.0 g/dL   RDW 87.2 88.4 - 84.4 %   Platelets 286 150 - 400 K/uL   nRBC 0.0 0.0 - 0.2 %  Troponin T, High Sensitivity     Status: None   Collection Time: 06/09/24  8:13 PM  Result Value Ref Range   Troponin T High Sensitivity <15 0 - 19 ng/L  hCG, quantitative, pregnancy     Status: Abnormal   Collection Time: 06/09/24  8:13 PM  Result Value Ref Range   hCG, Beta Chain, Quant, S 8,246 (H) <5 mIU/mL  Type and screen [facility]     Status: None (Preliminary result)   Collection Time: 06/09/24  8:20 PM  Result Value Ref Range   ABO/RH(D) O POS    Antibody Screen NEG    Sample Expiration      06/12/2024,2359 Performed at [facility], [address], [place], [place] [address]    Unit Number [id number]    Blood Component Type RED CELLS,LR    Unit division 00    Status of Unit ISSUED    Transfusion Status OK TO TRANSFUSE    Crossmatch Result COMPATIBLE    Unit Number [id number]    Blood Component Type RED CELLS,LR    Unit division 00    Status of Unit ISSUED    Transfusion Status OK TO TRANSFUSE    Crossmatch Result COMPATIBLE   Prepare RBC (crossmatch)     Status: None   Collection Time: 06/09/24  8:29 PM  Result Value Ref Range   Order Confirmation      ORDER PROCESSED BY BLOOD BANK Performed at [facility], [address] [place], [place] [address]   CBC with Differential/Platelet     Status: Abnormal   Collection Time:  06/10/24  6:02 AM  Result Value Ref Range   WBC 24.2 (H) 4.0 - 10.5 K/uL   RBC 2.99 (L) 3.87 - 5.11 MIL/uL   Hemoglobin 8.8 (L) 12.0 - 15.0  g/dL   HCT 74.7 (L) 63.9 - 53.9 %   MCV 84.3 80.0 - 100.0 fL   MCH 29.4 26.0 - 34.0 pg   MCHC 34.9 30.0 - 36.0 g/dL   RDW 85.8 88.4 - 84.4 %   Platelets 172 150 - 400 K/uL   nRBC 0.0 0.0 - 0.2 %   Neutrophils Relative % 93 %   Neutro Abs 22.4 (H) 1.7 - 7.7 K/uL   Lymphocytes Relative 5 %   Lymphs Abs 1.3 0.7 - 4.0 K/uL   Monocytes Relative 1 %   Monocytes Absolute 0.3 0.1 - 1.0 K/uL   Eosinophils Relative 0 %   Eosinophils Absolute 0.0 0.0 - 0.5 K/uL   Basophils Relative 0 %   Basophils Absolute 0.0 0.0 - 0.1 K/uL   Immature Granulocytes 1 %   Abs Immature Granulocytes 0.16 (H) 0.00 - 0.07 K/uL    US  OB Comp Less 14 Wks Result Date: 06/09/2024 CLINICAL DATA:  Pelvic pain EXAM: OBSTETRIC <14 WK ULTRASOUND TECHNIQUE: Transabdominal ultrasound was performed for evaluation of the gestation as well as the maternal uterus and adnexal regions. COMPARISON:  05/29/2024, 06/05/2024 FINDINGS: Intrauterine gestational sac: None Yolk sac:  Not Visualized. Embryo:  Not Visualized. Maternal uterus/adnexae: Left ovary is within normal limits and measures 1.5 x 3.3 x 2.8 cm. Right ovary measures 3.3 x 2 x 2.6 cm. Echogenic right adnexal mass adjacent to right ovary measuring 5.4 x 4 x 5.3 cm, could reflect adnexal hematoma or ectopic. Large volume complex fluid in the abdomen and pelvis. IMPRESSION: No IUP identified. 5.4 cm right adnexal mass with large volume complex free fluid throughout the abdomen and pelvis, findings are suspicious for ruptured ectopic pregnancy. Critical Value/emergent results were called by telephone at the time of interpretation on 06/09/2024 at 9:09 pm to provider [facility] , who verbally acknowledged these results. Patient proceeded directly to operating room after completion of ultrasound exam. Electronically Signed   By: Luke Bun  M.D.   On: 06/09/2024 21:09    Assessment/Plan: Ectopic Pregnancy (side unknown) Hemoperitoneum Hemodynamically unstable Surgery indicated (Diagnostic Laparoscopy)-consent obtained  Linton FORBES Larch 06/10/2024, 7:50 AM     [1]  Allergies Allergen Reactions   Sulfamethoxazole-Trimethoprim Hives   Penicillins Rash    Has patient had a PCN reaction causing immediate rash, facial/tongue/throat swelling, SOB or lightheadedness with hypotension: Yes  Has patient had a PCN reaction causing severe rash involving mucus membranes or skin necrosis: No  Has patient had a PCN reaction that required hospitalization No  Has patient had a PCN reaction occurring within the last 10 years: Yes  If all of the above answers are NO, then may proceed with Cephalosporin use.

## 2024-06-10 NOTE — Transfer of Care (Signed)
 Immediate Anesthesia Transfer of Care Note  Patient: Annette White  Procedure(s) Performed: LAPAROSCOPY, WITH ECTOPIC PREGNANCY SURGICAL TREATMENT, left salpingectomy with removal of ectopic  Patient Location: PACU  Anesthesia Type:General  Level of Consciousness: awake, alert , and oriented  Airway & Oxygen Therapy: Patient Spontanous Breathing  Post-op Assessment: Report given to RN and Post -op Vital signs reviewed and stable  Post vital signs: Reviewed and stable  Last Vitals:  Vitals Value Taken Time  BP 102/68 06/10/24 00:19  Temp    Pulse 111 06/10/24 00:21  Resp 18 06/10/24 00:21  SpO2 100 % 06/10/24 00:21  Vitals shown include unfiled device data.  Last Pain:  Vitals:   06/09/24 1748  PainSc: 10-Worst pain ever         Complications: No notable events documented.

## 2024-06-10 NOTE — Progress Notes (Signed)
" ° °  Diagnostic Laparoscopy; Left Ruptured Ectopic with Left Salpingectomy; adhesiolysis of bowel adhesion to Left fallopian tube OPERATIVE REPORT   DATE OF SURGERY: 06/10/2024  SURGEON: Veva Larch, DO ANESTHESIA: General  PROCEDURE: Diagnostic Laparoscopy; Left Ruptured Ectopic with Left Salpingectomy; adhesiolysis of bowel adhesion to Left fallopian tube    PREOPERATIVE DIAGNOSES: 1. Pelvic Pain 2. Hemoperitoneum 3. Hemodynamically unstable  POSTOPERATIVE DIAGNOSES: 1. Same 2. Bowel adhesions QBL:  1470cc DRAINS: foley catheter to gravity drainage, 200 ml of clear urine at end of the procedure TOTAL IV FLUIDS: 1700 ml SPECIMENS: Left fallopian tube with ectopic pregnancy COMPLICATIONS:  None  FINDINGS:  Large hemoperitoneum with ruptured ectopic in left fallopian tube; bowel adhesions in the posterior cul de sac and on the left fallopian tube  INDICATION and CONSENT: Annette White is a 28 y.o. [id number] with IUP at Unknown location presenting for pelvic pain. The patient understood that the risks of the diagnostic laparoscopy include, but are not limited to, visceral or vascular injury, infection, blood loss and need for transfusion, prolonged hospitalization, and reoperation.  The patient stated understanding and desired to proceed.  All questions were answered.  PROCEDURE:  After verbal and written informed consent was obtained, the patient was taken to the operating room where general anesthesia was found to be adequate.  SCDs were applied to the lower extremities and a Foley catheter was placed in the bladder under sterile technique.  The patient was placed in dorsal lithotomy. A foley catheter was placed.  Next, a sterile speculum was placed in the vagina and a Hulka manipulator was placed on the cervix.  Next, attention was turned to the abdomen, where a 5 mm incision was made in the umbilicus and a 5 mm port was placed under direct visualization.  The CO2 gas was  then turned on to insufflate the abdomen.  Immediately, a large hemoperitoneum was noted.  A left and right 5 mm port was placed under direct visualization.  The ruptured ectopic was noted to be in the left fallopian tube.  The Ligasure device was used to remove a bowel adhesion on the left tube.  The left fallopian tube with the ectopic was transected and hemostasis was noted.    Next, the umbilical incision ws converted to an 11 mm port and a trocar was placed.  The endo catch bag was then inserted into the port and the ectopic ws dropped into the bag and removed from the abdomen.  The rest of the time was spent removing the large hemoperitoneum, which contained a large amount of clots.  Pictures were taken before and after the procedure.  The CO2 gas was turned off and allowed to escape from the abdomen.  All instruments and ports were removed.  The 11 mm umbilical incision's fascia was repaired with 0-Vicryl. All of the port sites were repaired with 4-0-Vicryl in a subcuticular fashion.  The Hulka manipulator was removed and the cervix was hemostatic.  The foley was removed also.  All instrument counts were correct x 3.  The patient was taken to the recovery in stable condition.   Linton FORBES Larch, DO [facility]  "

## 2024-06-10 NOTE — Discharge Summary (Signed)
 Physician Discharge Summary  Patient ID: Annette White MRN: [id number] DOB/AGE: 1996-04-16 28 y.o.  Admit date: 06/09/2024 Discharge date: 06/10/2024  Admission Diagnoses:  Discharge Diagnoses:  Principal Problem:   Left tubal pregnancy without intrauterine pregnancy   Discharged Condition: good  Hospital Course: Presented to the ED with pelvic/abdominal pain.  Diagnosed with ectopic.  Received (2) units of RBCs and Laparoscopic surgery.  Consults: None  Significant Diagnostic Studies: Ultrasound (unofficial)  Treatments: surgery: Diagnostic Laposcopy with Left Salpingectomy with Left Ectopic and lysis of adhesions    Discharge Exam: Blood pressure (!) 92/52, pulse 80, temperature 98.1 F (36.7 C), temperature source Oral, resp. rate 18, height 5' 1 (1.549 m), weight 78 kg, last menstrual period 04/26/2024, SpO2 100%. Resp: clear to auscultation bilaterally Cardio: regular rate and rhythm, S1, S2 normal, no murmur, click, rub or gallop GI: soft, appropriate tenderness; bowel sounds normal; no masses,  no organomegaly Pelvic: no vaginal bleeding Incision/Wound: Clean/dry/intact  Disposition: Discharge disposition: 01-Home or Self Care       Discharge Instructions     Discharge patient   Complete by: As directed    Discharge disposition: 01-Home or Self Care   Discharge patient date: 06/10/2024      Allergies as of 06/10/2024       Reactions   Sulfamethoxazole-trimethoprim Hives   Penicillins Rash   Has patient had a PCN reaction causing immediate rash, facial/tongue/throat swelling, SOB or lightheadedness with hypotension: Yes Has patient had a PCN reaction causing severe rash involving mucus membranes or skin necrosis: No Has patient had a PCN reaction that required hospitalization No Has patient had a PCN reaction occurring within the last 10 years: Yes If all of the above answers are NO, then may proceed with Cephalosporin use.         Medication List     STOP taking these medications    norethindrone-ethinyl estradiol-FE 1-20 MG-MCG tablet Commonly known as: Junel FE 1/20   omeprazole  20 MG capsule Commonly known as: PRILOSEC       TAKE these medications    acetaminophen  500 MG tablet Commonly known as: TYLENOL  Take 2 tablets (1,000 mg total) by mouth every 6 (six) hours.   ibuprofen  800 MG tablet Commonly known as: ADVIL  Take 1 tablet (800 mg total) by mouth every 8 (eight) hours as needed.   oxyCODONE  5 MG immediate release tablet Commonly known as: Oxy IR/ROXICODONE  Take 1 tablet (5 mg total) by mouth every 4 (four) hours as needed for moderate pain (pain score 4-6).        Follow-up Information     Leigh Sober, MD. Schedule an appointment as soon as possible for a visit in 1 week(s).   Specialties: Obstetrics, Gynecology Why: follow up after ectopic surgical treatment Contact information: [address] [place] [address] [phone]                 Signed: Linton FORBES Larch 06/10/2024, 8:14 AM

## 2024-06-12 LAB — BPAM RBC
Blood Product Expiration Date: 202512302359
Blood Product Expiration Date: 202601012359
ISSUE DATE / TIME: 202512192037
ISSUE DATE / TIME: 202512192037
Unit Type and Rh: 9500
Unit Type and Rh: 9500

## 2024-06-12 LAB — TYPE AND SCREEN
ABO/RH(D): O POS
Antibody Screen: NEGATIVE
Unit division: 0
Unit division: 0

## 2024-06-13 LAB — SURGICAL PATHOLOGY

## 2024-06-30 ENCOUNTER — Ambulatory Visit: Payer: Self-pay | Admitting: Obstetrics and Gynecology

## 2024-06-30 ENCOUNTER — Telehealth: Payer: Self-pay | Admitting: Obstetrics

## 2024-06-30 NOTE — Op Note (Signed)
" ° °  Diagnostic Laparoscopy; Left Ruptured Ectopic with Left Salpingectomy; adhesiolysis of bowel adhesion to Left fallopian tube OPERATIVE REPORT   DATE OF SURGERY: 06/10/2024  SURGEON: Veva Larch, DO ANESTHESIA: General  PROCEDURE: Diagnostic Laparoscopy; Left Ruptured Ectopic with Left Salpingectomy; adhesiolysis of bowel adhesion to Left fallopian tube    PREOPERATIVE DIAGNOSES: 1. Pelvic Pain 2. Hemoperitoneum 3. Hemodynamically unstable  POSTOPERATIVE DIAGNOSES: 1. Same 2. Bowel adhesions QBL:  1470cc DRAINS: foley catheter to gravity drainage, 200 ml of clear urine at end of the procedure TOTAL IV FLUIDS: 1700 ml SPECIMENS: Left fallopian tube with ectopic pregnancy COMPLICATIONS:  None  FINDINGS:  Large hemoperitoneum with ruptured ectopic in left fallopian tube; bowel adhesions in the posterior cul de sac and on the left fallopian tube  INDICATION and CONSENT: Annette White is a 29 y.o. [id number] with IUP at Unknown location presenting for pelvic pain. The patient understood that the risks of the diagnostic laparoscopy include, but are not limited to, visceral or vascular injury, infection, blood loss and need for transfusion, prolonged hospitalization, and reoperation.  The patient stated understanding and desired to proceed.  All questions were answered.  PROCEDURE:  After verbal and written informed consent was obtained, the patient was taken to the operating room where general anesthesia was found to be adequate.  SCDs were applied to the lower extremities and a Foley catheter was placed in the bladder under sterile technique.  The patient was placed in dorsal lithotomy. A foley catheter was placed.  Next, a sterile speculum was placed in the vagina and a Hulka manipulator was placed on the cervix.  Next, attention was turned to the abdomen, where a 5 mm incision was made in the umbilicus and a 5 mm port was placed under direct visualization.  The CO2 gas was  then turned on to insufflate the abdomen.  Immediately, a large hemoperitoneum was noted.  A left and right 5 mm port was placed under direct visualization.  The ruptured ectopic was noted to be in the left fallopian tube.  The Ligasure device was used to remove a bowel adhesion on the left tube.  The left fallopian tube with the ectopic was transected and hemostasis was noted.    Next, the umbilical incision ws converted to an 11 mm port and a trocar was placed.  The endo catch bag was then inserted into the port and the ectopic ws dropped into the bag and removed from the abdomen.  The rest of the time was spent removing the large hemoperitoneum, which contained a large amount of clots.  Pictures were taken before and after the procedure.  The CO2 gas was turned off and allowed to escape from the abdomen.  All instruments and ports were removed.  The 11 mm umbilical incision's fascia was repaired with 0-Vicryl. All of the port sites were repaired with 4-0-Vicryl in a subcuticular fashion.  The Hulka manipulator was removed and the cervix was hemostatic.  The foley was removed also.  All instrument counts were correct x 3.  The patient was taken to the recovery in stable condition.   Linton FORBES Larch, DO [facility]  "

## 2024-06-30 NOTE — Telephone Encounter (Signed)
 Reached out to pt to schedule surgery f/u with Dr. Leigh per Dr. Davis. (With possible IUD placement).  Left message for pt to call back to schedule.  Dr. Leigh has a gyn spot available on Jan. 22 at 8:55.

## 2024-07-06 NOTE — Progress Notes (Unsigned)
" ° ° °  OBSTETRICS/GYNECOLOGY POST-OPERATIVE CLINIC VISIT  Subjective:     Annette White is a 29 y.o. female who presents to the clinic 4 weeks status post Diagnostic Laparoscopy; Left Ruptured Ectopic with Left Salpingectomy; adhesiolysis of bowel adhesion to Left fallopian tube by Dr. Davis after being admitted to the ED 06/09/24 for abdominal pain.   Eating a regular diet {with-without:5700} difficulty. Bowel movements are {normal/abnormal***:19619}. {pain control:13522::The patient is not having any pain.}  {Common ambulatory SmartLinks:19316}  Review of Systems {ros; complete:30496}   Objective:   LMP 04/26/2024  There is no height or weight on file to calculate BMI.  General:  alert and no distress  Abdomen: soft, bowel sounds active, non-tender  Incision:   {incision:13716::no dehiscence,incision well approximated,healing well,no drainage,no erythema,no hernia,no seroma,no swelling}    Pathology:  FINAL DIAGNOSIS        1. Fallopian tube, ectopic pregnancy, ectopic :       - FALLOPIAN TUBE WITH INTRALUMINAL CHORIONIC VILLI CONSISTENT WITH ECTOPIC       PRODUCTS OF CONCEPTION.    Assessment:   Patient s/p *** (surgery)  {doing well:13525::Doing well postoperatively.}   Plan:   1. Continue any current medications as instructed by provider. 2. Wound care discussed. 3. Operative findings again reviewed. Pathology report discussed. 4. Activity restrictions: {restrictions:13723} 5. Anticipated return to work: {work return:14002}. 6. Follow up: {8-89:86212} {time; units:18646} for ***    Vicci Rollo BRAVO, RN [facility]   "

## 2024-07-10 NOTE — Telephone Encounter (Signed)
 Pt has been scheduled with Dr. Leigh on 07/11/2024 for a post op f/u.

## 2024-07-11 ENCOUNTER — Encounter: Admitting: Obstetrics

## 2024-07-14 NOTE — Progress Notes (Unsigned)
" ° ° °  OBSTETRICS/GYNECOLOGY POST-OPERATIVE CLINIC VISIT  Subjective:     Annette White is a 29 y.o. female who presents to the clinic 5 weeks status post Diagnostic Laparoscopy; Left Ruptured Ectopic with Left Salpingectomy; adhesiolysis of bowel adhesion to Left fallopian tube by Dr. Davis after being admitted to the ED 06/09/24 for abdominal pain.   Eating a regular diet {with-without:5700} difficulty. Bowel movements are {normal/abnormal***:19619}. {pain control:13522::The patient is not having any pain.}  {Common ambulatory SmartLinks:19316}  Review of Systems {ros; complete:30496}   Objective:   LMP 04/26/2024  There is no height or weight on file to calculate BMI.  General:  alert and no distress  Abdomen: soft, bowel sounds active, non-tender  Incision:   {incision:13716::no dehiscence,incision well approximated,healing well,no drainage,no erythema,no hernia,no seroma,no swelling}    Pathology:  FINAL DIAGNOSIS        1. Fallopian tube, ectopic pregnancy, ectopic :       - FALLOPIAN TUBE WITH INTRALUMINAL CHORIONIC VILLI CONSISTENT WITH ECTOPIC       PRODUCTS OF CONCEPTION.    Assessment:   Patient s/p Diagnostic Laparoscopy; Left Ruptured Ectopic with Left Salpingectomy; adhesiolysis of bowel adhesion to Left fallopian tube (surgery)  {doing well:13525::Doing well postoperatively.}   Plan:   1. Continue any current medications as instructed by provider. 2. Wound care discussed. 3. Operative findings again reviewed. Pathology report discussed. 4. Activity restrictions: {restrictions:13723} 5. Anticipated return to work: {work return:14002}. 6. Follow up: {8-89:86212} {time; units:18646} for ***    Vicci Rollo BRAVO, RN [facility]   "

## 2024-07-18 ENCOUNTER — Ambulatory Visit: Admitting: Obstetrics

## 2024-07-18 ENCOUNTER — Encounter: Payer: Self-pay | Admitting: Obstetrics

## 2024-07-18 VITALS — BP 98/69 | HR 98 | Ht 61.0 in | Wt 176.1 lb

## 2024-07-18 DIAGNOSIS — Z4889 Encounter for other specified surgical aftercare: Secondary | ICD-10-CM

## 2024-07-18 DIAGNOSIS — Z09 Encounter for follow-up examination after completed treatment for conditions other than malignant neoplasm: Secondary | ICD-10-CM | POA: Diagnosis not present

## 2024-07-18 DIAGNOSIS — Z8742 Personal history of other diseases of the female genital tract: Secondary | ICD-10-CM | POA: Diagnosis not present

## 2024-07-18 DIAGNOSIS — Z30011 Encounter for initial prescription of contraceptive pills: Secondary | ICD-10-CM

## 2024-07-18 MED ORDER — JUNEL FE 1/20 1-20 MG-MCG PO TABS: 1.0000 | ORAL_TABLET | Freq: Every day | ORAL | 4 refills | Status: AC
